# Patient Record
Sex: Female | Born: 1970 | Race: White | Hispanic: No | Marital: Married | Smoking: Never smoker
Health system: Southern US, Community
[De-identification: ages and names within clinical notes are randomized; demographics above are authoritative.]

## PROBLEM LIST (undated history)

## (undated) DIAGNOSIS — F32A Depression, unspecified: Secondary | ICD-10-CM

## (undated) DIAGNOSIS — F419 Anxiety disorder, unspecified: Secondary | ICD-10-CM

## (undated) DIAGNOSIS — Z789 Other specified health status: Secondary | ICD-10-CM

## (undated) DIAGNOSIS — E785 Hyperlipidemia, unspecified: Secondary | ICD-10-CM

## (undated) DIAGNOSIS — F329 Major depressive disorder, single episode, unspecified: Secondary | ICD-10-CM

## (undated) HISTORY — DX: Hyperlipidemia, unspecified: E78.5

## (undated) HISTORY — DX: Anxiety disorder, unspecified: F41.9

---
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
## 1994-07-17 HISTORY — PX: TONSILLECTOMY: SUR1361

## 1998-09-24 ENCOUNTER — Encounter: Payer: Self-pay | Admitting: Family Medicine

## 1998-09-24 ENCOUNTER — Ambulatory Visit (HOSPITAL_COMMUNITY): Admission: RE | Admit: 1998-09-24 | Discharge: 1998-09-24 | Payer: Self-pay | Admitting: Family Medicine

## 1998-12-08 ENCOUNTER — Ambulatory Visit (HOSPITAL_COMMUNITY): Admission: RE | Admit: 1998-12-08 | Discharge: 1998-12-08 | Payer: Self-pay | Admitting: Family Medicine

## 1998-12-08 ENCOUNTER — Encounter: Payer: Self-pay | Admitting: Family Medicine

## 1999-03-06 ENCOUNTER — Emergency Department (HOSPITAL_COMMUNITY): Admission: EM | Admit: 1999-03-06 | Discharge: 1999-03-06 | Payer: Self-pay | Admitting: Emergency Medicine

## 1999-09-05 ENCOUNTER — Encounter: Payer: Self-pay | Admitting: *Deleted

## 1999-09-05 ENCOUNTER — Ambulatory Visit (HOSPITAL_COMMUNITY): Admission: RE | Admit: 1999-09-05 | Discharge: 1999-09-05 | Payer: Self-pay | Admitting: *Deleted

## 2001-09-09 ENCOUNTER — Other Ambulatory Visit: Admission: RE | Admit: 2001-09-09 | Discharge: 2001-09-09 | Payer: Self-pay | Admitting: *Deleted

## 2002-09-23 ENCOUNTER — Other Ambulatory Visit: Admission: RE | Admit: 2002-09-23 | Discharge: 2002-09-23 | Payer: Self-pay | Admitting: *Deleted

## 2003-09-17 ENCOUNTER — Inpatient Hospital Stay (HOSPITAL_COMMUNITY): Admission: AD | Admit: 2003-09-17 | Discharge: 2003-09-18 | Payer: Self-pay | Admitting: Obstetrics and Gynecology

## 2003-10-20 ENCOUNTER — Inpatient Hospital Stay (HOSPITAL_COMMUNITY): Admission: AD | Admit: 2003-10-20 | Discharge: 2003-10-22 | Payer: Self-pay | Admitting: Obstetrics and Gynecology

## 2003-11-17 ENCOUNTER — Other Ambulatory Visit: Admission: RE | Admit: 2003-11-17 | Discharge: 2003-11-17 | Payer: Self-pay | Admitting: Obstetrics and Gynecology

## 2003-12-17 ENCOUNTER — Ambulatory Visit (HOSPITAL_COMMUNITY): Admission: RE | Admit: 2003-12-17 | Discharge: 2003-12-17 | Payer: Self-pay | Admitting: Family Medicine

## 2004-11-28 ENCOUNTER — Other Ambulatory Visit: Admission: RE | Admit: 2004-11-28 | Discharge: 2004-11-28 | Payer: Self-pay | Admitting: Obstetrics and Gynecology

## 2005-04-04 ENCOUNTER — Ambulatory Visit: Payer: Self-pay | Admitting: Family Medicine

## 2005-04-21 ENCOUNTER — Ambulatory Visit: Payer: Self-pay | Admitting: Family Medicine

## 2005-06-10 ENCOUNTER — Inpatient Hospital Stay (HOSPITAL_COMMUNITY): Admission: AD | Admit: 2005-06-10 | Discharge: 2005-06-11 | Payer: Self-pay | Admitting: Obstetrics and Gynecology

## 2005-07-06 ENCOUNTER — Encounter (INDEPENDENT_AMBULATORY_CARE_PROVIDER_SITE_OTHER): Payer: Self-pay | Admitting: Specialist

## 2005-07-06 ENCOUNTER — Inpatient Hospital Stay (HOSPITAL_COMMUNITY): Admission: AD | Admit: 2005-07-06 | Discharge: 2005-07-08 | Payer: Self-pay | Admitting: Obstetrics and Gynecology

## 2005-08-09 ENCOUNTER — Ambulatory Visit: Payer: Self-pay | Admitting: Family Medicine

## 2005-10-10 ENCOUNTER — Ambulatory Visit: Payer: Self-pay | Admitting: Family Medicine

## 2006-04-24 DIAGNOSIS — F339 Major depressive disorder, recurrent, unspecified: Secondary | ICD-10-CM | POA: Insufficient documentation

## 2006-04-24 DIAGNOSIS — E7800 Pure hypercholesterolemia, unspecified: Secondary | ICD-10-CM | POA: Insufficient documentation

## 2006-12-14 ENCOUNTER — Encounter: Admission: RE | Admit: 2006-12-14 | Discharge: 2006-12-14 | Payer: Self-pay | Admitting: Obstetrics and Gynecology

## 2007-02-01 ENCOUNTER — Encounter: Admission: RE | Admit: 2007-02-01 | Discharge: 2007-02-01 | Payer: Self-pay | Admitting: Obstetrics and Gynecology

## 2007-02-08 ENCOUNTER — Encounter: Admission: RE | Admit: 2007-02-08 | Discharge: 2007-02-08 | Payer: Self-pay | Admitting: Obstetrics and Gynecology

## 2007-02-21 ENCOUNTER — Encounter: Admission: RE | Admit: 2007-02-21 | Discharge: 2007-02-21 | Payer: Self-pay | Admitting: Obstetrics and Gynecology

## 2007-07-31 ENCOUNTER — Encounter: Admission: RE | Admit: 2007-07-31 | Discharge: 2007-07-31 | Payer: Self-pay | Admitting: Radiology

## 2007-12-13 ENCOUNTER — Ambulatory Visit: Payer: Self-pay | Admitting: Family Medicine

## 2008-04-09 ENCOUNTER — Ambulatory Visit: Payer: Self-pay | Admitting: Family Medicine

## 2008-04-09 DIAGNOSIS — J111 Influenza due to unidentified influenza virus with other respiratory manifestations: Secondary | ICD-10-CM | POA: Insufficient documentation

## 2010-08-07 ENCOUNTER — Encounter: Payer: Self-pay | Admitting: Orthopedic Surgery

## 2010-12-02 NOTE — Discharge Summary (Signed)
NAME:  Cynthia [name], Cynthia [name]                      ACCOUNT NO.:  [id number]   MEDICAL RECORD NO.:  [id number]                   PATIENT TYPE:  INP   LOCATION:  9121                                 FACILITY:  [facility]   PHYSICIAN:  Gerrit Friends. Aldona Bar, M.D.                DATE OF BIRTH:  04/16/1971   DATE OF ADMISSION:  10/20/2003  DATE OF DISCHARGE:                                 DISCHARGE SUMMARY   DISCHARGE DIAGNOSES:  1. Term pregnancy delivered, 6-pound 15-ounce female infant, Apgars 8 and 9.  2. Blood type A positive.   PROCEDURES:  1. Low forceps delivery.  2. Partial third degree tear and repair.   SUMMARY:  This 40 year old primigravida was admitted at 39 weeks in labor.  She presented to the hospital with ruptured membranes - thick meconium-  stained fluid was noted.  She progressed to full dilatation and pushed to +3  station and fetal heart was noted to be bradycardic.  A vacuum extractor was  applied but did not provide adequate descent.  In order to proceed as  quickly as possible, forceps were used and applied without complication and  the baby was delivered in one push.  The baby had Apgars of 8 and 9, a cord  pH of 7.23, and did well.  A third degree tear was repaired without  difficulty.   On the morning of October 21, 2003 the patient's hemoglobin was 8.7 with a  white count of 17,400 and a platelet count of 215,000.  She did well  postpartum and on the morning of April 7 was ambulating without difficulty,  tolerating a regular diet well, was having no difficulty with breastfeeding,  was afebrile, vital signs were stable, perineal pain was controlled with  analgesia, and she was desirous of discharge.  Accordingly, she was given  all appropriate instructions and understood all instructions well.   DISCHARGE MEDICATIONS:  1. Vitamins one a day as long as she is breastfeeding.  2. Ferous sulfate 300 mg once to twice daily.  3. Motrin 600 mg q.6h. as needed for pain.  4. Tylox  one to two q.4-6h. as needed for severe pain.   She will return to the office for follow-up in approximately 4 weeks' time  or as needed.   CONDITION ON DISCHARGE:  Improved.                                               Gerrit Friends. Aldona Bar, M.D.    RMW/MEDQ  D:  10/22/2003  T:  10/22/2003  Job:  [id number]

## 2011-01-12 ENCOUNTER — Inpatient Hospital Stay (HOSPITAL_COMMUNITY)
Admission: AD | Admit: 2011-01-12 | Discharge: 2011-01-12 | Disposition: A | Discharge status: Home / Self Care | Payer: 59 | Source: Ambulatory Visit | Attending: Obstetrics & Gynecology | Admitting: Obstetrics & Gynecology

## 2011-01-12 DIAGNOSIS — O4700 False labor before 37 completed weeks of gestation, unspecified trimester: Secondary | ICD-10-CM | POA: Insufficient documentation

## 2011-01-20 ENCOUNTER — Inpatient Hospital Stay (HOSPITAL_COMMUNITY)
Admission: AD | Admit: 2011-01-20 | Discharge: 2011-01-20 | Disposition: A | Discharge status: Home / Self Care | Payer: 59 | Source: Ambulatory Visit | Attending: Obstetrics and Gynecology | Admitting: Obstetrics and Gynecology

## 2011-01-20 DIAGNOSIS — O479 False labor, unspecified: Secondary | ICD-10-CM | POA: Insufficient documentation

## 2011-01-27 ENCOUNTER — Inpatient Hospital Stay (HOSPITAL_COMMUNITY): Admission: RE | Admit: 2011-01-27 | Payer: Self-pay | Source: Ambulatory Visit

## 2011-01-27 ENCOUNTER — Inpatient Hospital Stay (HOSPITAL_COMMUNITY): Payer: 59 | Admitting: Anesthesiology

## 2011-01-27 ENCOUNTER — Inpatient Hospital Stay (HOSPITAL_COMMUNITY)
Admission: AD | Admit: 2011-01-27 | Discharge: 2011-01-29 | DRG: 775 | Disposition: A | Discharge status: Home / Self Care | Payer: 59 | Source: Ambulatory Visit | Attending: Obstetrics and Gynecology | Admitting: Obstetrics and Gynecology

## 2011-01-27 ENCOUNTER — Encounter (HOSPITAL_COMMUNITY): Payer: Self-pay | Admitting: Anesthesiology

## 2011-01-27 ENCOUNTER — Encounter (HOSPITAL_COMMUNITY): Payer: Self-pay | Admitting: *Deleted

## 2011-01-27 HISTORY — DX: Other specified health status: Z78.9

## 2011-01-27 LAB — CBC
HCT: 35.5 % — ABNORMAL LOW (ref 36.0–46.0)
MCH: 28.8 pg (ref 26.0–34.0)
MCHC: 33 g/dL (ref 30.0–36.0)
MCV: 87.4 fL (ref 78.0–100.0)
RDW: 14.1 % (ref 11.5–15.5)

## 2011-01-27 LAB — ABO/RH: RH Type: POSITIVE

## 2011-01-27 LAB — TYPE AND SCREEN: Antibody Screen: NEGATIVE

## 2011-01-27 MED ORDER — BENZOCAINE-MENTHOL 20-0.5 % EX AERO
INHALATION_SPRAY | CUTANEOUS | Status: AC
  Administered 2011-01-28: 1 via TOPICAL
  Filled 2011-01-27: qty 56

## 2011-01-27 MED ORDER — LIDOCAINE HCL (PF) 1 % IJ SOLN
30.0000 mL | Freq: Once | INTRAMUSCULAR | Status: DC | PRN
  Filled 2011-01-27 (×2): qty 30

## 2011-01-27 MED ORDER — FENTANYL 2.5 MCG/ML BUPIVACAINE 1/10 % EPIDURAL INFUSION (WH - ANES): 2.0000 mL/h | INTRAMUSCULAR | Status: DC

## 2011-01-27 MED ORDER — SODIUM CHLORIDE 0.9 % IJ SOLN: 3.0000 mL | INTRAMUSCULAR | Status: DC | PRN

## 2011-01-27 MED ORDER — ZOLPIDEM TARTRATE 5 MG PO TABS: 5.0000 mg | ORAL_TABLET | Freq: Every evening | ORAL | Status: DC | PRN

## 2011-01-27 MED ORDER — LACTATED RINGERS IV SOLN: 500.0000 mL | Freq: Once | INTRAVENOUS | Status: DC

## 2011-01-27 MED ORDER — DIPHENHYDRAMINE HCL 25 MG PO CAPS: 25.0000 mg | ORAL_CAPSULE | Freq: Four times a day (QID) | ORAL | Status: DC | PRN

## 2011-01-27 MED ORDER — LIDOCAINE HCL 1.5 % IJ SOLN
INTRAMUSCULAR | Status: DC | PRN
  Administered 2011-01-27 (×2): 4 mL

## 2011-01-27 MED ORDER — SODIUM CHLORIDE 0.9 % IJ SOLN: 3.0000 mL | Freq: Two times a day (BID) | INTRAMUSCULAR | Status: DC

## 2011-01-27 MED ORDER — EPHEDRINE 5 MG/ML INJ
10.0000 mg | INTRAVENOUS | Status: DC | PRN
  Filled 2011-01-27: qty 4

## 2011-01-27 MED ORDER — LACTATED RINGERS IV SOLN: 500.0000 mL | INTRAVENOUS | Status: DC | PRN

## 2011-01-27 MED ORDER — PHENYLEPHRINE 40 MCG/ML (10ML) SYRINGE FOR IV PUSH (FOR BLOOD PRESSURE SUPPORT)
80.0000 ug | PREFILLED_SYRINGE | INTRAVENOUS | Status: DC | PRN
  Filled 2011-01-27: qty 5

## 2011-01-27 MED ORDER — LACTATED RINGERS IV SOLN
500.0000 mL | Freq: Once | INTRAVENOUS | Status: DC
Start: 2011-01-27 — End: 2011-01-27

## 2011-01-27 MED ORDER — PHENYLEPHRINE 40 MCG/ML (10ML) SYRINGE FOR IV PUSH (FOR BLOOD PRESSURE SUPPORT)
80.0000 ug | PREFILLED_SYRINGE | INTRAVENOUS | Status: DC | PRN
  Filled 2011-01-27 (×2): qty 5

## 2011-01-27 MED ORDER — IBUPROFEN 600 MG PO TABS: 600.0000 mg | ORAL_TABLET | Freq: Four times a day (QID) | ORAL | Status: DC | PRN

## 2011-01-27 MED ORDER — ONDANSETRON HCL 4 MG/2ML IJ SOLN: 4.0000 mg | Freq: Four times a day (QID) | INTRAMUSCULAR | Status: DC | PRN

## 2011-01-27 MED ORDER — FERROUS SULFATE 325 (65 FE) MG PO TABS
325.0000 mg | ORAL_TABLET | Freq: Two times a day (BID) | ORAL | Status: DC
  Administered 2011-01-28 – 2011-01-29 (×3): 325 mg via ORAL
  Filled 2011-01-27 (×3): qty 1

## 2011-01-27 MED ORDER — FLEET ENEMA 7-19 GM/118ML RE ENEM: 1.0000 | ENEMA | RECTAL | Status: DC | PRN

## 2011-01-27 MED ORDER — OXYTOCIN 20 UNITS IN LACTATED RINGERS INFUSION - SIMPLE
1.0000 m[IU]/min | INTRAVENOUS | Status: DC
  Administered 2011-01-27: 2 m[IU]/min via INTRAVENOUS

## 2011-01-27 MED ORDER — IBUPROFEN 800 MG PO TABS
800.0000 mg | ORAL_TABLET | Freq: Three times a day (TID) | ORAL | Status: DC
  Administered 2011-01-27 – 2011-01-29 (×5): 800 mg via ORAL
  Filled 2011-01-27 (×5): qty 1

## 2011-01-27 MED ORDER — LANOLIN HYDROUS EX OINT: TOPICAL_OINTMENT | CUTANEOUS | Status: DC | PRN

## 2011-01-27 MED ORDER — SIMETHICONE 80 MG PO CHEW: 80.0000 mg | CHEWABLE_TABLET | ORAL | Status: DC | PRN

## 2011-01-27 MED ORDER — ACETAMINOPHEN 325 MG PO TABS: 650.0000 mg | ORAL_TABLET | ORAL | Status: DC | PRN

## 2011-01-27 MED ORDER — METHYLERGONOVINE MALEATE 0.2 MG PO TABS: 0.2000 mg | ORAL_TABLET | ORAL | Status: DC | PRN

## 2011-01-27 MED ORDER — RHO D IMMUNE GLOBULIN 1500 UNIT/2ML IJ SOLN: 300.0000 ug | Freq: Once | INTRAMUSCULAR | Status: DC

## 2011-01-27 MED ORDER — EPHEDRINE 5 MG/ML INJ: 10.0000 mg | INTRAVENOUS | Status: DC | PRN

## 2011-01-27 MED ORDER — ONDANSETRON HCL 4 MG PO TABS: 4.0000 mg | ORAL_TABLET | ORAL | Status: DC | PRN

## 2011-01-27 MED ORDER — SODIUM CHLORIDE 0.9 % IV SOLN: 250.0000 mL | INTRAVENOUS | Status: DC

## 2011-01-27 MED ORDER — BENZOCAINE-MENTHOL 20-0.5 % EX AERO
1.0000 "application " | INHALATION_SPRAY | CUTANEOUS | Status: DC | PRN
  Administered 2011-01-28: 1 via TOPICAL

## 2011-01-27 MED ORDER — TERBUTALINE SULFATE 1 MG/ML IJ SOLN: 0.2500 mg | Freq: Once | INTRAMUSCULAR | Status: DC | PRN

## 2011-01-27 MED ORDER — MAGNESIUM HYDROXIDE 400 MG/5ML PO SUSP: 30.0000 mL | ORAL | Status: DC | PRN

## 2011-01-27 MED ORDER — NALBUPHINE SYRINGE 5 MG/0.5 ML
5.0000 mg | INJECTION | INTRAMUSCULAR | Status: DC | PRN
  Filled 2011-01-27: qty 0.5

## 2011-01-27 MED ORDER — OXYTOCIN 20 UNITS IN LACTATED RINGERS INFUSION - SIMPLE
125.0000 mL/h | Freq: Once | INTRAVENOUS | Status: AC
  Administered 2011-01-27: 125 mL/h via INTRAVENOUS
  Filled 2011-01-27: qty 1000

## 2011-01-27 MED ORDER — PHENYLEPHRINE 40 MCG/ML (10ML) SYRINGE FOR IV PUSH (FOR BLOOD PRESSURE SUPPORT): 80.0000 ug | PREFILLED_SYRINGE | INTRAVENOUS | Status: DC | PRN

## 2011-01-27 MED ORDER — LACTATED RINGERS IV SOLN: INTRAVENOUS | Status: DC

## 2011-01-27 MED ORDER — OXYCODONE-ACETAMINOPHEN 5-325 MG PO TABS: 2.0000 | ORAL_TABLET | ORAL | Status: DC | PRN

## 2011-01-27 MED ORDER — TETANUS-DIPHTH-ACELL PERTUSSIS 5-2.5-18.5 LF-MCG/0.5 IM SUSP
0.5000 mL | Freq: Once | INTRAMUSCULAR | Status: AC
  Administered 2011-01-28: 0.5 mL via INTRAMUSCULAR
  Filled 2011-01-27: qty 0.5

## 2011-01-27 MED ORDER — MEASLES, MUMPS & RUBELLA VAC ~~LOC~~ INJ
0.5000 mL | INJECTION | Freq: Once | SUBCUTANEOUS | Status: DC
  Filled 2011-01-27: qty 0.5

## 2011-01-27 MED ORDER — SENNOSIDES-DOCUSATE SODIUM 8.6-50 MG PO TABS
1.0000 | ORAL_TABLET | Freq: Every day | ORAL | Status: DC
  Administered 2011-01-27: 1 via ORAL
  Administered 2011-01-28: 2 via ORAL

## 2011-01-27 MED ORDER — FENTANYL 2.5 MCG/ML BUPIVACAINE 1/10 % EPIDURAL INFUSION (WH - ANES)
2.0000 mL/h | INTRAMUSCULAR | Status: DC
  Administered 2011-01-27: 14 mL/h via EPIDURAL
  Filled 2011-01-27: qty 60

## 2011-01-27 MED ORDER — EPHEDRINE 5 MG/ML INJ
10.0000 mg | INTRAVENOUS | Status: DC | PRN
  Filled 2011-01-27 (×2): qty 4

## 2011-01-27 MED ORDER — PRENATAL PLUS 27-1 MG PO TABS
1.0000 | ORAL_TABLET | Freq: Every day | ORAL | Status: DC
  Administered 2011-01-28 – 2011-01-29 (×2): 1 via ORAL
  Filled 2011-01-27 (×2): qty 1

## 2011-01-27 MED ORDER — DIPHENHYDRAMINE HCL 50 MG/ML IJ SOLN: 12.5000 mg | INTRAMUSCULAR | Status: DC | PRN

## 2011-01-27 MED ORDER — CITRIC ACID-SODIUM CITRATE 334-500 MG/5ML PO SOLN: 30.0000 mL | ORAL | Status: DC | PRN

## 2011-01-27 MED ORDER — WITCH HAZEL-GLYCERIN EX PADS: MEDICATED_PAD | CUTANEOUS | Status: DC | PRN

## 2011-01-27 NOTE — Progress Notes (Signed)
Patient was C/C/3 and pushed for 1 ctx with epidural.   NSVD  maile infant, Apgars 8,9, weight P.   The patient had 1 midline laceration repaired with 2-0vicryl r. Fundus was firm. EBL was expected. Placenta was delivered intact. Vagina was clear.  Baby was vigorous to bedside.

## 2011-01-27 NOTE — Anesthesia Procedure Notes (Addendum)
Epidural Patient location during procedure: OB Start time: 01/27/2011 11:58 AM  Staffing Anesthesiologist: Mando Blatz A. Performed by: anesthesiologist   Preanesthetic Checklist Completed: patient identified, site marked, surgical consent, pre-op evaluation, timeout performed, IV checked, risks and benefits discussed and monitors and equipment checked  Epidural Patient position: sitting Prep: site prepped and draped and DuraPrep Patient monitoring: continuous pulse ox and blood pressure Approach: midline Injection technique: LOR air  Needle:  Needle type: Tuohy  Needle gauge: 17 G Needle length: 9 cm Needle insertion depth: 5 cm Catheter type: closed end flexible Catheter size: 19 Gauge Catheter at skin depth: 10 cm Test dose: negative and 1.5% lidocaine  Assessment Events: blood not aspirated, injection not painful, no injection resistance, negative IV test and no paresthesia

## 2011-01-27 NOTE — Progress Notes (Signed)
  Patient comes in c/o for elective induction.  Otherwise has good fetal movement and no bleeding.  Past Medical History  Diagnosis Date  . No pertinent past medical history     Past Surgical History  Procedure Date  . Wisdom tooth extraction 1990  . Tonsillectomy 1996    OB History    Grav Para Term Preterm Abortions TAB SAB Ect Mult Living   4 2 2  0 1 0 1 0 0 2     # Outc Date GA Lbr Len/2nd Wgt Sex Del Anes PTL Lv   1 TRM            2 TRM            3 SAB            4 CUR               History   Social History  . Marital Status: Married    Spouse Name: N/A    Number of Children: N/A  . Years of Education: N/A   Occupational History  . Not on file.   Social History Main Topics  . Smoking status: Never Smoker   . Smokeless tobacco: Not on file  . Alcohol Use: No  . Drug Use: No  . Sexually Active: Yes   Other Topics Concern  . Not on file   Social History Narrative  . No narrative on file   Promethazine hcl   Prenatal Course:  uncomplicated  Filed Vitals:   01/27/11 0900  Temp:   Resp: 18     Lungs/Cor:  NAD Abdomen:  soft, gravid Ex:  no cords, erythema SVE:  3/60/-1 FHTs:  140s, good STV, NST R Toco:  occ    A/P   [redacted]w[redacted]d for induction.    GBS neg.  Aqsa Sensabaugh A

## 2011-01-28 LAB — CBC
Hemoglobin: 10.3 g/dL — ABNORMAL LOW (ref 12.0–15.0)
MCH: 29.2 pg (ref 26.0–34.0)
MCHC: 33.7 g/dL (ref 30.0–36.0)
Platelets: 248 10*3/uL (ref 150–400)
RDW: 14 % (ref 11.5–15.5)

## 2011-01-28 MED ORDER — OXYCODONE-ACETAMINOPHEN 5-325 MG PO TABS
1.0000 | ORAL_TABLET | ORAL | Status: DC | PRN
  Administered 2011-01-28: 1 via ORAL
  Administered 2011-01-28: 2 via ORAL
  Filled 2011-01-28: qty 1
  Filled 2011-01-28: qty 2

## 2011-01-28 NOTE — Progress Notes (Signed)
  Patient is eating, ambulating, voiding.  Pain control is good.  Filed Vitals:   01/27/11 1803 01/27/11 1900 01/27/11 2300 01/28/11 0700  BP: 110/77 110/72 97/66 105/71  Pulse: 76 66 80 70  Temp: 98.4 F (36.9 C) 98.4 F (36.9 C) 98 F (36.7 C) 97.5 F (36.4 C)  TempSrc: Oral Oral Oral Oral  Resp: 18 16 18 18   SpO2:        Fundus firm Perineum without swelling.  Lab Results  Component Value Date   WBC 12.9* 01/28/2011   HGB 10.3* 01/28/2011   HCT 30.6* 01/28/2011   MCV 86.7 01/28/2011   PLT 248 01/28/2011       A/P  Routine care.  Expect d/c per plan.  Parents desire circumcision- all risks d/w them.  Maudie Shingledecker A

## 2011-01-28 NOTE — H&P (Signed)
H&P is in progress notes. Cynthia [name]

## 2011-01-28 NOTE — Progress Notes (Signed)
BREASTFEEDING CONSULTATION SERVICES INFORMATION GIVEN TO PATIENT.  OBSERVED PROPER TECHNIQUE FOR POSITIONING AND LATCH SCORE OF 9.  BASIC TEACHING DONE.  ENCOURAGED TO CALL FOR ASSIST OR CONCERNS.

## 2011-01-29 MED ORDER — IBUPROFEN 800 MG PO TABS: 800.0000 mg | ORAL_TABLET | Freq: Three times a day (TID) | ORAL | Status: AC

## 2011-01-29 NOTE — Progress Notes (Signed)
READY FOR DISCHARGE.  PATIENT FEELING GOOD ABOUT HOW BREASTFEEDING IS GOING.  ENCOURAGED TO CALL LACTATION OFFICE FOR ANY QUESTIONS OR CONCERNS.

## 2011-01-29 NOTE — Progress Notes (Signed)
Patient is eating, ambulating, voiding.  Pain control is good.  Filed Vitals:   01/28/11 0700 01/28/11 1500 01/28/11 2200 01/29/11 0520  BP: 105/71 109/73 122/72 112/70  Pulse: 70 81 72 78  Temp: 97.5 F (36.4 C)  98.3 F (36.8 C) 98.3 F (36.8 C)  TempSrc: Oral Oral Oral Oral  Resp: 18 18 18 18   SpO2:        Fundus firm Perineum without swelling.  Lab Results  Component Value Date   WBC 12.9* 01/28/2011   HGB 10.3* 01/28/2011   HCT 30.6* 01/28/2011   MCV 86.7 01/28/2011   PLT 248 01/28/2011       A/P  Routine care.  Expect d/c per plan.    Areil Ottey A

## 2011-01-29 NOTE — Discharge Summary (Signed)
Obstetric Discharge Summary Reason for Admission: induction of labor Prenatal Procedures: none Intrapartum Procedures: spontaneous vaginal delivery Postpartum Procedures: none Complications-Operative and Postpartum: 2 degree perineal laceration  Hemoglobin  Date Value Range Status  01/28/2011 10.3* 12.0-15.0 (g/dL) Final     HCT  Date Value Range Status  01/28/2011 30.6* 36.0-46.0 (%) Final    Discharge Diagnoses: Term Pregnancy-delivered  Discharge Information: Date: 01/29/2011 Activity: pelvic rest Diet: routine Medications: None Condition: stable Instructions: refer to practice specific booklet Discharge to: home   Newborn Data: Live born  Information for the patient's newborn:  Mikayla, Chiusano [[id number]]  female ; APGAR , ; weight ;  Home with mother.  Alayjah Boehringer A 01/29/2011, 7:31 AM

## 2011-02-01 NOTE — Anesthesia Postprocedure Evaluation (Signed)
  Anesthesia Post-op Note  Patient: Cynthia [name]  Procedure(s) Performed: * Lumbar Epidural for L & D *  Patient Location: Labor and Delivery  Anesthesia Type: Epidural  Level of Consciousness: awake, alert  and oriented  Airway and Oxygen Therapy: Patient Spontanous Breathing  Post-op Pain: none  Post-op Assessment: Post-op Vital signs reviewed, Patient's Cardiovascular Status Stable, Respiratory Function Stable, No signs of Nausea or vomiting, Pain level controlled, No headache, No backache, No residual numbness and No residual motor weakness  Post-op Vital Signs: Reviewed and stable  Complications: No apparent anesthesia complications

## 2011-02-01 NOTE — Anesthesia Preprocedure Evaluation (Signed)
Anesthesia Evaluation  Name, MR# and DOB Patient awake  General Assessment Comment  Reviewed: Allergy & Precautions, H&P  and Patient's Chart, lab work & pertinent test results  Airway Mallampati: III TM Distance: >3 FB Neck ROM: full    Dental No notable dental hx    Pulmonaryneg pulmonary ROS    clear to auscultation  pulmonary exam normal   Cardiovascular    Neuro/PsychNegative Neurological ROS Negative Psych ROS  GI/Hepatic/Renal negative GI ROS, negative Liver ROS, and negative Renal ROS (+)       Endo/Other  Negative Endocrine ROS (+)   Abdominal   Musculoskeletal  Hematology negative hematology ROS (+)   Peds  Reproductive/Obstetrics (+) Pregnancy   Anesthesia Other Findings             Anesthesia Physical Anesthesia Plan  ASA: II  Anesthesia Plan: Epidural   Post-op Pain Management:    Induction:   Airway Management Planned:   Additional Equipment:   Intra-op Plan:   Post-operative Plan:   Informed Consent: I have reviewed the patients History and Physical, chart, labs and discussed the procedure including the risks, benefits and alternatives for the proposed anesthesia with the patient or authorized representative who has indicated his/her understanding and acceptance.     Plan Discussed with: Anesthesiologist  Anesthesia Plan Comments:         Anesthesia Quick Evaluation

## 2011-03-21 ENCOUNTER — Other Ambulatory Visit: Payer: Self-pay | Admitting: Obstetrics and Gynecology

## 2011-05-15 ENCOUNTER — Other Ambulatory Visit: Payer: Self-pay | Admitting: Orthopedic Surgery

## 2011-05-15 ENCOUNTER — Ambulatory Visit
Admission: RE | Admit: 2011-05-15 | Discharge: 2011-05-15 | Disposition: A | Discharge status: Home / Self Care | Payer: 59 | Source: Ambulatory Visit | Attending: Orthopedic Surgery | Admitting: Orthopedic Surgery

## 2011-05-15 DIAGNOSIS — M419 Scoliosis, unspecified: Secondary | ICD-10-CM

## 2012-01-01 ENCOUNTER — Emergency Department
Admission: EM | Admit: 2012-01-01 | Discharge: 2012-01-01 | Disposition: A | Discharge status: Home / Self Care | Payer: 59 | Source: Home / Self Care | Attending: Family Medicine | Admitting: Family Medicine

## 2012-01-01 ENCOUNTER — Encounter: Payer: Self-pay | Admitting: *Deleted

## 2012-01-01 DIAGNOSIS — J029 Acute pharyngitis, unspecified: Secondary | ICD-10-CM

## 2012-01-01 HISTORY — DX: Major depressive disorder, single episode, unspecified: F32.9

## 2012-01-01 HISTORY — DX: Depression, unspecified: F32.A

## 2012-01-01 LAB — POCT RAPID STREP A (OFFICE): Rapid Strep A Screen: NEGATIVE

## 2012-01-01 MED ORDER — AMOXICILLIN 875 MG PO TABS: 875.0000 mg | ORAL_TABLET | Freq: Two times a day (BID) | ORAL | Status: AC

## 2012-01-01 NOTE — Discharge Instructions (Signed)
If cold-like symptoms develop, begin:  Mucinex D (guaifenesin with decongestant) twice daily for congestion.  Increase fluid intake, rest. May use Afrin nasal spray (or generic oxymetazoline) twice daily for about 5 days.  Also recommend using saline nasal spray several times daily and saline nasal irrigation (AYR is a common brand) Stop all antihistamines for now, and other non-prescription cough/cold preparations. May take Ibuprofen 200mg , 4 tabs every 8 hours with food for sore throat, body aches, fever, etc. Follow-up with family doctor if not improving 7 to 10 days.

## 2012-01-01 NOTE — ED Notes (Signed)
Pt c/o sore throat, fever, and joint pain x 1 day. She also reports a possible spider bite to the back of her LT thigh x 3 days ago. She has taken tylenol and applied benadryl cream to bite.

## 2012-01-01 NOTE — ED Provider Notes (Signed)
History     CSN: [id number]  Arrival date & time 01/01/12  1437   First MD Initiated Contact with Patient 01/01/12 1451      Chief Complaint  Patient presents with  . Sore Throat  . Fever  . Joint Pain     HPI Comments: Yesterday patient developed chills, fever, myalgias, mild sore throat, headache, and soreness in anterior cervical lymph nodes.  Three days ago while outdoors she noticed some type of insect bite on her left medial thigh.  There was significant initial urticarial swelling that has receded significantly. No tick bites.  She has two children at home that have just been treated for documented strep pharyngitis.  The history is provided by the patient.    Past Medical History  Diagnosis Date  . No pertinent past medical history   . Depression     Past Surgical History  Procedure Date  . Wisdom tooth extraction 1990  . Tonsillectomy 1996    History reviewed. No pertinent family history.  History  Substance Use Topics  . Smoking status: Never Smoker   . Smokeless tobacco: Not on file  . Alcohol Use: Yes    OB History    Grav Para Term Preterm Abortions TAB SAB Ect Mult Living   4 3 3  0 1 0 1 0 0 3      Review of Systems + mild sore throat, and soreness in anterior lymph nodes No cough No pleuritic pain No wheezing No nasal congestion ? post-nasal drainage No sinus pain/pressure No itchy/red eyes ? earache No hemoptysis No SOB No fever, + chills + nausea No vomiting No abdominal pain No diarrhea No urinary symptoms + skin rash left medial thigh + fatigue + myalgias + headache Used OTC meds without relief  Allergies  Promethazine hcl  Home Medications   Current Outpatient Rx  Name Route Sig Dispense Refill  . SERTRALINE HCL 100 MG PO TABS Oral Take 50 mg by mouth daily.    . AMOXICILLIN 875 MG PO TABS Oral Take 1 tablet (875 mg total) by mouth 2 (two) times daily. 20 tablet 0    BP 106/74  Pulse 114  Temp 101 F (38.3 C)  (Oral)  Resp 18  Ht 5\' 7"  (1.702 m)  Wt 185 lb 4 oz (84.029 kg)  BMI 29.01 kg/m2  SpO2 98%  LMP 12/11/2011  Physical Exam  Skin:          On the left anterior medial thigh just above the left knee is a 4cm patch of macular erythema that appears to be resolving.     Nursing notes and Vital Signs reviewed. Appearance:  Patient appears healthy, stated age, and in no acute distress Eyes:  Pupils are equal, round, and reactive to light and accomodation.  Extraocular movement is intact.  Conjunctivae are not inflamed  Ears:  Canals normal.  Tympanic membranes normal.  Nose:  Mildly congested turbinates.  No sinus tenderness.   Pharynx:  Normal Neck:  Supple.  Tender enlarged anterior/posterior nodes are palpated bilaterally  Lungs:  Clear to auscultation.  Breath sounds are equal.  Heart:  Regular rate and rhythm without murmurs, rubs, or gallops.  Abdomen:  Nontender without masses or hepatosplenomegaly.  Bowel sounds are present.  No CVA or flank tenderness.  Extremities:  No edema.  No calf tenderness    ED Course  Procedures  none   Labs Reviewed  POCT RAPID STREP A (OFFICE) negative  STREP A DNA PROBE  pending      1. Acute pharyngitis       MDM  With a history of two children at home with strep infections, will empirically begin amoxicillin. Throat culture pending. If cold-like symptoms develop, begin:  Mucinex D (guaifenesin with decongestant) twice daily for congestion.  Increase fluid intake, rest. May use Afrin nasal spray (or generic oxymetazoline) twice daily for about 5 days.  Also recommend using saline nasal spray several times daily and saline nasal irrigation (AYR is a common brand) Stop all antihistamines for now, and other non-prescription cough/cold preparations. May take Ibuprofen 200mg , 4 tabs every 8 hours with food for sore throat, body aches, fever, etc. Follow-up with family doctor if not improving 7 to 10 days.         Lattie Haw,  MD 01/01/12 [phone]

## 2012-01-02 ENCOUNTER — Telehealth: Payer: Self-pay | Admitting: *Deleted

## 2012-01-02 NOTE — ED Notes (Signed)
Called pt given throat culture results, advised her to complete all of her Amoxicillin. Pt agrees.

## 2012-01-09 ENCOUNTER — Encounter: Payer: Self-pay | Admitting: *Deleted

## 2012-01-09 ENCOUNTER — Emergency Department
Admission: EM | Admit: 2012-01-09 | Discharge: 2012-01-09 | Disposition: A | Discharge status: Home / Self Care | Payer: BC Managed Care – PPO | Source: Home / Self Care

## 2012-01-09 DIAGNOSIS — R05 Cough: Secondary | ICD-10-CM

## 2012-01-09 DIAGNOSIS — R059 Cough, unspecified: Secondary | ICD-10-CM

## 2012-01-09 DIAGNOSIS — J069 Acute upper respiratory infection, unspecified: Secondary | ICD-10-CM

## 2012-01-09 MED ORDER — GUAIFENESIN-CODEINE 100-10 MG/5ML PO SYRP: 5.0000 mL | ORAL_SOLUTION | Freq: Four times a day (QID) | ORAL | Status: AC | PRN

## 2012-01-09 MED ORDER — AZITHROMYCIN 250 MG PO TABS: ORAL_TABLET | ORAL | Status: AC

## 2012-01-09 MED ORDER — PREDNISONE (PAK) 10 MG PO TABS: 10.0000 mg | ORAL_TABLET | Freq: Every day | ORAL | Status: AC

## 2012-01-09 NOTE — ED Notes (Signed)
Patient c/o dry cough with intermittent fever and sweats x 8 days. She has used delsym and IBF OTC

## 2012-01-09 NOTE — ED Provider Notes (Signed)
History     CSN: [id number]  Arrival date & time 01/09/12  1509   None     Chief Complaint  Patient presents with  . Cough    (Consider location/radiation/quality/duration/timing/severity/associated sxs/prior treatment) HPI Cynthia [name] is a 41 y.o. female who complains of onset of cold symptoms for 2 weeks.  The symptoms are constant and mild-moderate in severity.  She was seen here last week for he positive culture or strep pharyngitis.  She was given amoxicillin and she took all the pills as directed.  He states the sore throat symptoms went away fairly quickly.  However she has been dealing with a persistent cough and some other upper respiratory symptoms as well.  Her baby is also sick with similar symptoms.  She is taking over-the-counter cough syrup which is helping a little bit. No sore throat + cough No pleuritic pain No wheezing + nasal congestion + post-nasal drainage + sinus pain/pressure No chest congestion No itchy/red eyes No earache No hemoptysis No SOB + chills/sweats No fever No nausea No vomiting No abdominal pain No diarrhea No skin rashes No fatigue No myalgias No headache    Past Medical History  Diagnosis Date  . No pertinent past medical history   . Depression     Past Surgical History  Procedure Date  . Wisdom tooth extraction 1990  . Tonsillectomy 1996    No family history on file.  History  Substance Use Topics  . Smoking status: Never Smoker   . Smokeless tobacco: Not on file  . Alcohol Use: Yes    OB History    Grav Para Term Preterm Abortions TAB SAB Ect Mult Living   4 3 3  0 1 0 1 0 0 3      Review of Systems  All other systems reviewed and are negative.    Allergies  Promethazine hcl  Home Medications   Current Outpatient Rx  Name Route Sig Dispense Refill  . AMOXICILLIN 875 MG PO TABS Oral Take 1 tablet (875 mg total) by mouth 2 (two) times daily. 20 tablet 0  . AZITHROMYCIN 250 MG PO TABS  Use as directed 1  each 0  . GUAIFENESIN-CODEINE 100-10 MG/5ML PO SYRP Oral Take 5 mLs by mouth 4 (four) times daily as needed for cough or congestion. 120 mL 0  . PREDNISONE (PAK) 10 MG PO TABS Oral Take 1 tablet (10 mg total) by mouth daily. 6 day pack, use as directed, Disp 1 pack 21 tablet 0  . SERTRALINE HCL 100 MG PO TABS Oral Take 50 mg by mouth daily.      BP 116/80  Pulse 87  Temp 98 F (36.7 C) (Oral)  Resp 16  Ht 5\' 7"  (1.702 m)  Wt 184 lb (83.462 kg)  BMI 28.82 kg/m2  SpO2 97%  LMP 12/11/2011  Physical Exam  Nursing note and vitals reviewed. Constitutional: She is oriented to person, place, and time. She appears well-developed and well-nourished.  HENT:  Head: Normocephalic and atraumatic.  Right Ear: Tympanic membrane, external ear and ear canal normal.  Left Ear: Tympanic membrane, external ear and ear canal normal.  Nose: Mucosal edema and rhinorrhea present.  Mouth/Throat: Posterior oropharyngeal erythema present. No oropharyngeal exudate or posterior oropharyngeal edema.  Eyes: No scleral icterus.  Neck: Neck supple.  Cardiovascular: Regular rhythm and normal heart sounds.   Pulmonary/Chest: Effort normal and breath sounds normal. No respiratory distress. She has no decreased breath sounds. She has no wheezes. She has no  rhonchi.  Neurological: She is alert and oriented to person, place, and time.  Skin: Skin is warm and dry.  Psychiatric: She has a normal mood and affect. Her speech is normal.    ED Course  Procedures (including critical care time)  Labs Reviewed - No data to display No results found.   1. Cough   2. Acute upper respiratory infections of unspecified site       MDM  1)  the patient just finished her round of amoxicillin for strep pharyngitis.  I will give her prescription for prednisone and Cheratussin.  I also gave her prescription for a Z-Pak but I would like her to hold for a couple days before taking this.  It is currently likely viral versus just  an irritant pharyngitis. 2)  Use nasal saline solution (over the counter) at least 3 times a day. 3)  Use over the counter decongestants like Zyrtec-D every 12 hours as needed to help with congestion.  If you have hypertension, do not take medicines with sudafed.  4)  Can take tylenol every 6 hours or motrin every 8 hours for pain or fever. 5)  Follow up with your primary doctor if no improvement in 5-7 days, sooner if increasing pain, fever, or new symptoms.     Marlaine Hind, MD 01/09/12 [phone]

## 2013-07-14 ENCOUNTER — Other Ambulatory Visit: Payer: Self-pay | Admitting: Obstetrics and Gynecology

## 2013-07-14 DIAGNOSIS — N6310 Unspecified lump in the right breast, unspecified quadrant: Secondary | ICD-10-CM

## 2013-07-15 ENCOUNTER — Ambulatory Visit
Admission: RE | Admit: 2013-07-15 | Discharge: 2013-07-15 | Disposition: A | Discharge status: Home / Self Care | Payer: BC Managed Care – PPO | Source: Ambulatory Visit | Attending: Obstetrics and Gynecology | Admitting: Obstetrics and Gynecology

## 2013-07-15 ENCOUNTER — Other Ambulatory Visit: Payer: Self-pay | Admitting: Obstetrics and Gynecology

## 2013-07-15 DIAGNOSIS — N6310 Unspecified lump in the right breast, unspecified quadrant: Secondary | ICD-10-CM

## 2013-07-16 ENCOUNTER — Ambulatory Visit
Admission: RE | Admit: 2013-07-16 | Discharge: 2013-07-16 | Disposition: A | Discharge status: Home / Self Care | Payer: BC Managed Care – PPO | Source: Ambulatory Visit | Attending: Obstetrics and Gynecology | Admitting: Obstetrics and Gynecology

## 2013-07-16 ENCOUNTER — Other Ambulatory Visit: Payer: BC Managed Care – PPO

## 2013-07-16 ENCOUNTER — Other Ambulatory Visit: Payer: Self-pay | Admitting: Obstetrics and Gynecology

## 2013-07-16 DIAGNOSIS — N6310 Unspecified lump in the right breast, unspecified quadrant: Secondary | ICD-10-CM

## 2013-07-16 HISTORY — PX: BREAST BIOPSY: SHX20

## 2013-07-21 ENCOUNTER — Other Ambulatory Visit: Payer: BC Managed Care – PPO

## 2013-08-06 ENCOUNTER — Encounter (INDEPENDENT_AMBULATORY_CARE_PROVIDER_SITE_OTHER): Payer: Self-pay

## 2013-08-06 ENCOUNTER — Ambulatory Visit (INDEPENDENT_AMBULATORY_CARE_PROVIDER_SITE_OTHER): Payer: BC Managed Care – PPO | Admitting: Physical Therapy

## 2013-08-06 DIAGNOSIS — R269 Unspecified abnormalities of gait and mobility: Secondary | ICD-10-CM

## 2013-08-06 DIAGNOSIS — R609 Edema, unspecified: Secondary | ICD-10-CM

## 2013-08-06 DIAGNOSIS — M25669 Stiffness of unspecified knee, not elsewhere classified: Secondary | ICD-10-CM

## 2013-08-06 DIAGNOSIS — M25569 Pain in unspecified knee: Secondary | ICD-10-CM

## 2013-08-06 DIAGNOSIS — M25469 Effusion, unspecified knee: Secondary | ICD-10-CM

## 2013-08-07 ENCOUNTER — Encounter (INDEPENDENT_AMBULATORY_CARE_PROVIDER_SITE_OTHER): Payer: BC Managed Care – PPO

## 2013-08-07 DIAGNOSIS — M25469 Effusion, unspecified knee: Secondary | ICD-10-CM

## 2013-08-07 DIAGNOSIS — M25669 Stiffness of unspecified knee, not elsewhere classified: Secondary | ICD-10-CM

## 2013-08-07 DIAGNOSIS — M25569 Pain in unspecified knee: Secondary | ICD-10-CM

## 2013-08-07 DIAGNOSIS — R609 Edema, unspecified: Secondary | ICD-10-CM

## 2013-08-07 DIAGNOSIS — R269 Unspecified abnormalities of gait and mobility: Secondary | ICD-10-CM

## 2013-08-13 ENCOUNTER — Encounter (INDEPENDENT_AMBULATORY_CARE_PROVIDER_SITE_OTHER): Payer: BC Managed Care – PPO | Admitting: Physical Therapy

## 2013-08-13 DIAGNOSIS — M25469 Effusion, unspecified knee: Secondary | ICD-10-CM

## 2013-08-13 DIAGNOSIS — M25569 Pain in unspecified knee: Secondary | ICD-10-CM

## 2013-08-13 DIAGNOSIS — M25669 Stiffness of unspecified knee, not elsewhere classified: Secondary | ICD-10-CM

## 2013-08-13 DIAGNOSIS — R229 Localized swelling, mass and lump, unspecified: Secondary | ICD-10-CM

## 2013-08-13 DIAGNOSIS — R233 Spontaneous ecchymoses: Secondary | ICD-10-CM

## 2013-08-14 ENCOUNTER — Encounter (INDEPENDENT_AMBULATORY_CARE_PROVIDER_SITE_OTHER): Payer: BC Managed Care – PPO | Admitting: Physical Therapy

## 2013-08-14 DIAGNOSIS — R269 Unspecified abnormalities of gait and mobility: Secondary | ICD-10-CM

## 2013-08-14 DIAGNOSIS — R609 Edema, unspecified: Secondary | ICD-10-CM

## 2013-08-14 DIAGNOSIS — M25469 Effusion, unspecified knee: Secondary | ICD-10-CM

## 2013-08-14 DIAGNOSIS — M25569 Pain in unspecified knee: Secondary | ICD-10-CM

## 2013-08-18 ENCOUNTER — Encounter (INDEPENDENT_AMBULATORY_CARE_PROVIDER_SITE_OTHER): Payer: BC Managed Care – PPO | Admitting: Physical Therapy

## 2013-08-18 DIAGNOSIS — R269 Unspecified abnormalities of gait and mobility: Secondary | ICD-10-CM

## 2013-08-18 DIAGNOSIS — M25469 Effusion, unspecified knee: Secondary | ICD-10-CM

## 2013-08-18 DIAGNOSIS — R609 Edema, unspecified: Secondary | ICD-10-CM

## 2013-08-18 DIAGNOSIS — M25569 Pain in unspecified knee: Secondary | ICD-10-CM

## 2013-08-18 DIAGNOSIS — M25669 Stiffness of unspecified knee, not elsewhere classified: Secondary | ICD-10-CM

## 2013-08-20 ENCOUNTER — Encounter (INDEPENDENT_AMBULATORY_CARE_PROVIDER_SITE_OTHER): Payer: BC Managed Care – PPO | Admitting: Physical Therapy

## 2013-08-20 DIAGNOSIS — M25669 Stiffness of unspecified knee, not elsewhere classified: Secondary | ICD-10-CM

## 2013-08-20 DIAGNOSIS — R269 Unspecified abnormalities of gait and mobility: Secondary | ICD-10-CM

## 2013-08-20 DIAGNOSIS — M25469 Effusion, unspecified knee: Secondary | ICD-10-CM

## 2013-08-20 DIAGNOSIS — R609 Edema, unspecified: Secondary | ICD-10-CM

## 2013-08-20 DIAGNOSIS — M25569 Pain in unspecified knee: Secondary | ICD-10-CM

## 2013-08-25 ENCOUNTER — Encounter (INDEPENDENT_AMBULATORY_CARE_PROVIDER_SITE_OTHER): Payer: BC Managed Care – PPO | Admitting: Physical Therapy

## 2013-08-25 DIAGNOSIS — M25469 Effusion, unspecified knee: Secondary | ICD-10-CM

## 2013-08-25 DIAGNOSIS — R269 Unspecified abnormalities of gait and mobility: Secondary | ICD-10-CM

## 2013-08-25 DIAGNOSIS — M25669 Stiffness of unspecified knee, not elsewhere classified: Secondary | ICD-10-CM

## 2013-08-25 DIAGNOSIS — M25569 Pain in unspecified knee: Secondary | ICD-10-CM

## 2013-08-25 DIAGNOSIS — R609 Edema, unspecified: Secondary | ICD-10-CM

## 2013-08-27 ENCOUNTER — Encounter (INDEPENDENT_AMBULATORY_CARE_PROVIDER_SITE_OTHER): Payer: BC Managed Care – PPO

## 2013-08-27 DIAGNOSIS — M25469 Effusion, unspecified knee: Secondary | ICD-10-CM

## 2013-08-27 DIAGNOSIS — R609 Edema, unspecified: Secondary | ICD-10-CM

## 2013-08-27 DIAGNOSIS — M25569 Pain in unspecified knee: Secondary | ICD-10-CM

## 2013-08-27 DIAGNOSIS — R269 Unspecified abnormalities of gait and mobility: Secondary | ICD-10-CM

## 2013-09-01 ENCOUNTER — Other Ambulatory Visit: Payer: Self-pay | Admitting: Obstetrics and Gynecology

## 2013-09-01 DIAGNOSIS — Z09 Encounter for follow-up examination after completed treatment for conditions other than malignant neoplasm: Secondary | ICD-10-CM

## 2013-09-09 ENCOUNTER — Ambulatory Visit
Admission: RE | Admit: 2013-09-09 | Discharge: 2013-09-09 | Disposition: A | Discharge status: Home / Self Care | Payer: BC Managed Care – PPO | Source: Ambulatory Visit | Attending: Obstetrics and Gynecology | Admitting: Obstetrics and Gynecology

## 2013-09-09 DIAGNOSIS — Z09 Encounter for follow-up examination after completed treatment for conditions other than malignant neoplasm: Secondary | ICD-10-CM

## 2013-10-20 ENCOUNTER — Encounter: Payer: Self-pay | Admitting: Physician Assistant

## 2013-10-20 ENCOUNTER — Ambulatory Visit (INDEPENDENT_AMBULATORY_CARE_PROVIDER_SITE_OTHER): Payer: BC Managed Care – PPO | Admitting: Physician Assistant

## 2013-10-20 VITALS — BP 119/68 | HR 96 | Ht 67.0 in | Wt 192.0 lb

## 2013-10-20 DIAGNOSIS — R05 Cough: Secondary | ICD-10-CM

## 2013-10-20 DIAGNOSIS — B9689 Other specified bacterial agents as the cause of diseases classified elsewhere: Secondary | ICD-10-CM

## 2013-10-20 DIAGNOSIS — J329 Chronic sinusitis, unspecified: Secondary | ICD-10-CM

## 2013-10-20 DIAGNOSIS — A499 Bacterial infection, unspecified: Secondary | ICD-10-CM

## 2013-10-20 DIAGNOSIS — R059 Cough, unspecified: Secondary | ICD-10-CM

## 2013-10-20 MED ORDER — HYDROCODONE-HOMATROPINE 5-1.5 MG/5ML PO SYRP: 5.0000 mL | ORAL_SOLUTION | Freq: Every evening | ORAL | Status: DC | PRN

## 2013-10-20 MED ORDER — AMOXICILLIN 500 MG PO CAPS: 500.0000 mg | ORAL_CAPSULE | Freq: Two times a day (BID) | ORAL | Status: DC

## 2013-10-20 MED ORDER — METHYLPREDNISOLONE SODIUM SUCC 125 MG IJ SOLR
125.0000 mg | Freq: Once | INTRAMUSCULAR | Status: AC
  Administered 2013-10-20: 125 mg via INTRAMUSCULAR

## 2013-10-20 NOTE — Patient Instructions (Signed)

## 2013-10-20 NOTE — Progress Notes (Addendum)
Subjective:    Patient ID: Cynthia [name], female    DOB: [birth year], 43 y.o.   MRN: [id number]  HPI Patient is a 43 year old female who presents to the clinic to establish care. Patient does not take any ongoing medications. At one point she was diagnosed with hypercholesterolemia. She is not on any medication for that. She has had depression in the past. She denies any feelings of helplessness or hopelessness today.  She she does have some sinus pressure for the last 10 days. Her teeth ache.  She has recently felt some chest tightness. She denies any wheezing or shortness of breath. Her cough is dry. Her cough is worsened at night and keeps her up. She thought it could be allergies but has continued to worsen. She denies any fever, chills, nausea or vomiting. She's been taking over-the-counter Sudafed and Mucinex. She is flying on Thursday to leave town and would like to be better before she leaves. She does not have any past medical history of lung diseases/asthma.  . Active Ambulatory Problems    Diagnosis Date Noted  . HYPERCHOLESTEROLEMIA 04/24/2006  . DEPRESSION, MAJOR, RECURRENT 04/24/2006  . INFLUENZA, WITH RESPIRATORY SYMPTOMS 04/09/2008   Resolved Ambulatory Problems    Diagnosis Date Noted  . No Resolved Ambulatory Problems   Past Medical History  Diagnosis Date  . No pertinent past medical history   . Depression    . Family History  Problem Relation Age of Onset  . Diabetes Father   . Cancer Maternal Grandmother   . Alcohol abuse Paternal Grandfather    . History   Social History  . Marital Status: Married    Spouse Name: N/A    Number of Children: N/A  . Years of Education: N/A   Occupational History  . Not on file.   Social History Main Topics  . Smoking status: Never Smoker   . Smokeless tobacco: Not on file  . Alcohol Use: Yes  . Drug Use: No  . Sexual Activity: Yes   Other Topics Concern  . Not on file   Social History Narrative  . No  narrative on file      Review of Systems  All other systems reviewed and are negative.       Objective:   Physical Exam  Constitutional: She is oriented to person, place, and time. She appears well-developed and well-nourished.  HENT:  Head: Normocephalic and atraumatic.  TM's clear bilaterally.  Maxillary tenderness to palpation bilaterally.  Oropharynx erythematous with no exudate or swollen tonsils.  Bilateral nasal turbinates red and swollen.   Eyes: Conjunctivae are normal. Right eye exhibits no discharge. Left eye exhibits no discharge.  Neck: Normal range of motion. Neck supple.  Cardiovascular: Normal rate, regular rhythm and normal heart sounds.   Pulmonary/Chest: Effort normal and breath sounds normal. She has no wheezes.  Lymphadenopathy:    She has no cervical adenopathy.  Neurological: She is alert and oriented to person, place, and time.  Skin: Skin is warm and dry.  Psychiatric: She has a normal mood and affect. Her behavior is normal.          Assessment & Plan:  Depression screen 0/2.  Sinusitis/cough-Solu-Medrol 125 mg was given IM today. Amoxil was given for 10 days. Hycodan for cough at bedtime. Warned of sedation with cough syrup. Call if not improving in the next 24-48 hours since will be flying on Thursday. Recommended nasal spray over-the-counter Flonase 2 sprays each nostril daily.  Discuss  with patient making a visit for a complete physical with fasting labs. She does have an OB that does labs regularly. I suggested that she see if they can fax her most recent labs to make sure everything is up-to-date. Vaccines he seemed up-to-date.

## 2014-05-18 ENCOUNTER — Encounter: Payer: Self-pay | Admitting: Physician Assistant

## 2014-08-20 ENCOUNTER — Other Ambulatory Visit: Payer: Self-pay | Admitting: Obstetrics and Gynecology

## 2014-08-20 DIAGNOSIS — R52 Pain, unspecified: Secondary | ICD-10-CM

## 2014-08-20 DIAGNOSIS — Z803 Family history of malignant neoplasm of breast: Secondary | ICD-10-CM

## 2014-08-20 DIAGNOSIS — N63 Unspecified lump in unspecified breast: Secondary | ICD-10-CM

## 2014-08-21 ENCOUNTER — Other Ambulatory Visit: Payer: Self-pay | Admitting: Obstetrics and Gynecology

## 2014-08-21 DIAGNOSIS — N63 Unspecified lump in unspecified breast: Secondary | ICD-10-CM

## 2014-08-21 DIAGNOSIS — Z803 Family history of malignant neoplasm of breast: Secondary | ICD-10-CM

## 2014-08-21 DIAGNOSIS — R52 Pain, unspecified: Secondary | ICD-10-CM

## 2014-08-28 ENCOUNTER — Encounter (INDEPENDENT_AMBULATORY_CARE_PROVIDER_SITE_OTHER): Payer: Self-pay

## 2014-08-28 ENCOUNTER — Ambulatory Visit
Admission: RE | Admit: 2014-08-28 | Discharge: 2014-08-28 | Disposition: A | Discharge status: Home / Self Care | Payer: BLUE CROSS/BLUE SHIELD | Source: Ambulatory Visit | Attending: Obstetrics and Gynecology | Admitting: Obstetrics and Gynecology

## 2014-08-28 DIAGNOSIS — R52 Pain, unspecified: Secondary | ICD-10-CM

## 2014-08-28 DIAGNOSIS — Z803 Family history of malignant neoplasm of breast: Secondary | ICD-10-CM

## 2014-08-28 DIAGNOSIS — N63 Unspecified lump in unspecified breast: Secondary | ICD-10-CM

## 2015-02-23 ENCOUNTER — Encounter: Payer: Self-pay | Admitting: Family Medicine

## 2015-02-23 ENCOUNTER — Ambulatory Visit (INDEPENDENT_AMBULATORY_CARE_PROVIDER_SITE_OTHER): Payer: BLUE CROSS/BLUE SHIELD | Admitting: Family Medicine

## 2015-02-23 VITALS — BP 98/61 | HR 57 | Wt 172.0 lb

## 2015-02-23 DIAGNOSIS — G43709 Chronic migraine without aura, not intractable, without status migrainosus: Secondary | ICD-10-CM

## 2015-02-23 DIAGNOSIS — F515 Nightmare disorder: Secondary | ICD-10-CM | POA: Diagnosis not present

## 2015-02-23 DIAGNOSIS — IMO0002 Reserved for concepts with insufficient information to code with codable children: Secondary | ICD-10-CM

## 2015-02-23 MED ORDER — TOPIRAMATE 25 MG PO TABS: ORAL_TABLET | ORAL | Status: DC

## 2015-02-23 NOTE — Assessment & Plan Note (Addendum)
start Topamax. Recheck in one month. If not better would consider CT scan of the head

## 2015-02-23 NOTE — Patient Instructions (Signed)
Thank you for coming in today. Consider counseling[facility] [place]  I will be happy to write a referral if needed.  return in 3-4 weeks

## 2015-02-23 NOTE — Progress Notes (Signed)
Cynthia [name] is a 44 y.o. female who presents to [facility]  today for headaches and bad dreams.  1) patient has 3 months of persistent severe headache. She has headaches 2 or 3 times a week and the headaches to the last all day. She denies any weakness or numbness or loss of function. The headaches are bilateral pounding. She's tried aspirin and Tylenol which helps some. No fevers chills nausea vomiting or diarrhea.  2) Bad dreams. Patient is a 7 with history of newly every night skin area disturbing dreams. She dreams that her children have been killed. Sometimes when she is awake she'll have intrusive bothersome thoughts. She feels no compulsion to act on these thoughts. This is obviously very distressing to the patient.   Past Medical History  Diagnosis Date  . No pertinent past medical history   . Depression    Past Surgical History  Procedure Laterality Date  . Wisdom tooth extraction  1990  . Tonsillectomy  1996   History  Substance Use Topics  . Smoking status: Never Smoker   . Smokeless tobacco: Not on file  . Alcohol Use: Yes   ROS as above Medications: Current Outpatient Prescriptions  Medication Sig Dispense Refill  . topiramate (TOPAMAX) 25 MG tablet Start 1 tablet daily at bedtime, increase to 1 tablet twice daily after 5days and increase to 2 tablets at night and 1 tablet in the morning after 5 days. 90 tablet 0   No current facility-administered medications for this visit.   Allergies  Allergen Reactions  . Promethazine Hcl     REACTION: convulsion     Exam:  BP 98/61 mmHg  Pulse 57  Wt 172 lb (78.019 kg) Gen: Well NAD HEENT: EOMI,  MMM PERRLA Lungs: Normal work of breathing. CTABL Heart: RRR no MRG Abd: NABS, Soft. Nondistended, Nontender Exts: Brisk capillary refill, warm and well perfused.  Neuro: Alert and oriented normal coordination balance gait strength sensation reflexes. Psych alert and oriented  tearful affect. No SI or HI. PHQ9 10, GAD 7 14  No results found for this or any previous visit (from the past 24 hour(s)). No results found.   Please see individual assessment and plan sections.

## 2015-02-23 NOTE — Assessment & Plan Note (Signed)
Unclear etiology. Patient has some symptoms suggestive of obsessive thoughts. Recommend counseling. Start Topamax for headache. Recheck in one month. If not better we consider SSRI.

## 2015-03-23 ENCOUNTER — Ambulatory Visit (INDEPENDENT_AMBULATORY_CARE_PROVIDER_SITE_OTHER): Payer: BLUE CROSS/BLUE SHIELD | Admitting: Family Medicine

## 2015-03-23 DIAGNOSIS — G43709 Chronic migraine without aura, not intractable, without status migrainosus: Secondary | ICD-10-CM

## 2015-03-23 NOTE — Progress Notes (Signed)
Gust Brooms "no showed" today's visit.

## 2015-07-21 ENCOUNTER — Encounter: Payer: Self-pay | Admitting: Physician Assistant

## 2015-07-21 ENCOUNTER — Ambulatory Visit (INDEPENDENT_AMBULATORY_CARE_PROVIDER_SITE_OTHER): Payer: BLUE CROSS/BLUE SHIELD | Admitting: Physician Assistant

## 2015-07-21 VITALS — BP 133/87 | HR 82 | Temp 98.7°F | Wt 180.0 lb

## 2015-07-21 DIAGNOSIS — J029 Acute pharyngitis, unspecified: Secondary | ICD-10-CM | POA: Diagnosis not present

## 2015-07-21 DIAGNOSIS — M436 Torticollis: Secondary | ICD-10-CM | POA: Diagnosis not present

## 2015-07-21 LAB — CBC WITH DIFFERENTIAL/PLATELET
BASOS ABS: 0 10*3/uL (ref 0.0–0.1)
Basophils Relative: 0 % (ref 0–1)
EOS PCT: 2 % (ref 0–5)
Eosinophils Absolute: 0.2 10*3/uL (ref 0.0–0.7)
HEMATOCRIT: 41.6 % (ref 36.0–46.0)
HEMOGLOBIN: 14.3 g/dL (ref 12.0–15.0)
LYMPHS ABS: 1.8 10*3/uL (ref 0.7–4.0)
LYMPHS PCT: 16 % (ref 12–46)
MCH: 29 pg (ref 26.0–34.0)
MCHC: 34.4 g/dL (ref 30.0–36.0)
MCV: 84.4 fL (ref 78.0–100.0)
MPV: 9.5 fL (ref 8.6–12.4)
Monocytes Absolute: 0.8 10*3/uL (ref 0.1–1.0)
Monocytes Relative: 7 % (ref 3–12)
NEUTROS ABS: 8.5 10*3/uL — AB (ref 1.7–7.7)
Neutrophils Relative %: 75 % (ref 43–77)
Platelets: 295 10*3/uL (ref 150–400)
RBC: 4.93 MIL/uL (ref 3.87–5.11)
RDW: 13.5 % (ref 11.5–15.5)
WBC: 11.3 10*3/uL — ABNORMAL HIGH (ref 4.0–10.5)

## 2015-07-21 LAB — POCT RAPID STREP A (OFFICE): Rapid Strep A Screen: NEGATIVE

## 2015-07-21 MED ORDER — AMOXICILLIN-POT CLAVULANATE 875-125 MG PO TABS: 1.0000 | ORAL_TABLET | Freq: Two times a day (BID) | ORAL | Status: DC

## 2015-07-21 NOTE — Progress Notes (Signed)
   Subjective:    Patient ID: Cynthia [name], female    DOB: 12/22/1970, 45 y.o.   MRN: [id number]  HPI Pt presents to the clinic for sore throat and neck stiffness for 3 days. She reports fever of 101 last night. She does not have tonsils but has had strep throat once without tonsils and she feels the same. She has no other body aches or chills. Her ears feel "poppy". She cannot move her neck without a lot of pain. She is taking tylenol and ibuprofen around the clock. She has no cough, SOB, runny nose, congestion.    Review of Systems See HPI.     Objective:   Physical Exam  Constitutional: She is oriented to person, place, and time. She appears well-developed and well-nourished.  HENT:  Head: Normocephalic and atraumatic.  Right Ear: External ear normal.  Left Ear: External ear normal.  TM's clear. Good light reflex.  Oropharynx erythematous, tonsils absent, uvula midline.  Bilateral nasal turbinates red and swollen. Negative for any sinus tenderness to palpation.    Eyes: Conjunctivae are normal. Right eye exhibits no discharge. Left eye exhibits no discharge.  Neck:  ROM decreased due to pain.  Bilateral anterior cervical tender and enlarged lymph nodes.  Cardiovascular: Normal rate, regular rhythm and normal heart sounds.   Pulmonary/Chest: Effort normal and breath sounds normal. She has no wheezes.  Lymphadenopathy:    She has cervical adenopathy.  Neurological: She is alert and oriented to person, place, and time.  Psychiatric: She has a normal mood and affect. Her behavior is normal.          Assessment & Plan:  Sore throat/stiff neck- rapid strep was negative. Will send for culture. centor criteria 3 points and 35 percent chance of strep. Pt reports similar symptoms historically. Sent augmentin for 10 days.  Will stop abx if strep culture comes back negative or patient not improving. Cbc ordered. Discussed concern for meningitis. If symptoms worsening or not  improving need to consider lumbar puncture.

## 2015-07-22 LAB — CULTURE, GROUP A STREP: Organism ID, Bacteria: NORMAL

## 2015-10-21 ENCOUNTER — Other Ambulatory Visit: Payer: Self-pay | Admitting: Obstetrics and Gynecology

## 2015-10-22 ENCOUNTER — Other Ambulatory Visit: Payer: Self-pay | Admitting: Obstetrics and Gynecology

## 2015-10-22 DIAGNOSIS — N63 Unspecified lump in unspecified breast: Secondary | ICD-10-CM

## 2015-10-22 LAB — CYTOLOGY - PAP

## 2015-11-01 ENCOUNTER — Ambulatory Visit
Admission: RE | Admit: 2015-11-01 | Discharge: 2015-11-01 | Disposition: A | Discharge status: Home / Self Care | Payer: BLUE CROSS/BLUE SHIELD | Source: Ambulatory Visit | Attending: Obstetrics and Gynecology | Admitting: Obstetrics and Gynecology

## 2015-11-01 DIAGNOSIS — N63 Unspecified lump in unspecified breast: Secondary | ICD-10-CM

## 2015-11-27 ENCOUNTER — Emergency Department
Admission: EM | Admit: 2015-11-27 | Discharge: 2015-11-27 | Disposition: A | Discharge status: Home / Self Care | Payer: BLUE CROSS/BLUE SHIELD | Source: Home / Self Care | Attending: Family Medicine | Admitting: Family Medicine

## 2015-11-27 ENCOUNTER — Encounter: Payer: Self-pay | Admitting: *Deleted

## 2015-11-27 DIAGNOSIS — H65191 Other acute nonsuppurative otitis media, right ear: Secondary | ICD-10-CM | POA: Diagnosis not present

## 2015-11-27 DIAGNOSIS — J069 Acute upper respiratory infection, unspecified: Secondary | ICD-10-CM

## 2015-11-27 MED ORDER — AMOXICILLIN-POT CLAVULANATE 875-125 MG PO TABS: 1.0000 | ORAL_TABLET | Freq: Two times a day (BID) | ORAL | Status: DC

## 2015-11-27 NOTE — Discharge Instructions (Signed)
You may take 400-675m Ibuprofen (Motrin) every 6-8 hours for fever and pain  Alternate with Tylenol  You may take 5062mTylenol every 4-6 hours as needed for fever and pain  Follow-up with your primary care provider next week for recheck of symptoms if not improving.  Be sure to drink plenty of fluids and rest, at least 8hrs of sleep a night, preferably more while you are sick. Return urgent care or go to closest ER if you cannot keep down fluids/signs of dehydration, fever not reducing with Tylenol, difficulty breathing/wheezing, stiff neck, worsening condition, or other concerns (see below)  Please take antibiotics as prescribed and be sure to complete entire course even if you start to feel better to ensure infection does not come back.   Cool Mist Vaporizers Vaporizers may help relieve the symptoms of a cough and cold. They add moisture to the air, which helps mucus to become thinner and less sticky. This makes it easier to breathe and cough up secretions. Cool mist vaporizers do not cause serious burns like hot mist vaporizers, which may also be called steamers or humidifiers. Vaporizers have not been proven to help with colds. You should not use a vaporizer if you are allergic to mold. HOME CARE INSTRUCTIONS  Follow the package instructions for the vaporizer.  Do not use anything other than distilled water in the vaporizer.  Do not run the vaporizer all of the time. This can cause mold or bacteria to grow in the vaporizer.  Clean the vaporizer after each time it is used.  Clean and dry the vaporizer well before storing it.  Stop using the vaporizer if worsening respiratory symptoms develop.   This information is not intended to replace advice given to you by your health care provider. Make sure you discuss any questions you have with your health care provider.   Document Released: 03/30/2004 Document Revised: 07/08/2013 Document Reviewed: 11/20/2012 Elsevier Interactive Patient  Education 20[employer]

## 2015-11-27 NOTE — ED Provider Notes (Signed)
CSN: [id number]     Arrival date & time 11/27/15  1241 History   First MD Initiated Contact with Patient 11/27/15 1309     Chief Complaint  Patient presents with  . Sore Throat  . Otalgia   (Consider location/radiation/quality/duration/timing/severity/associated sxs/prior Treatment) HPI  The pt is a 45yo female presenting to [facility] with c/o 2 days of sore throat, bilateral ear pain, congestion, and mild intermittent cough. Right is is more aching and sore than the left. Pain is mild to moderate in severity. She has tried tylenol with no relief.  She is here with her husband who has had URI symptoms for 1 week and daughter with an intermittent cough for 2 weeks. Pt reports subjective fever. Denies n/v/d.   Past Medical History  Diagnosis Date  . No pertinent past medical history   . Depression    Past Surgical History  Procedure Laterality Date  . Wisdom tooth extraction  1990  . Tonsillectomy  1996   Family History  Problem Relation Age of Onset  . Diabetes Father   . Skin cancer Father   . Cancer Maternal Grandmother   . Alcohol abuse Paternal Grandfather   . Parkinson's disease Mother   . Lupus Sister    Social History  Substance Use Topics  . Smoking status: Never Smoker   . Smokeless tobacco: None  . Alcohol Use: Yes   OB History    Gravida Para Term Preterm AB TAB SAB Ectopic Multiple Living   4 3 3  0 1 0 1 0 0 3     Review of Systems  Constitutional: Negative for fever and chills.  HENT: Positive for congestion, ear pain ( bilateral) and sore throat. Negative for trouble swallowing and voice change.   Respiratory: Positive for cough. Negative for shortness of breath.   Cardiovascular: Negative for chest pain and palpitations.  Gastrointestinal: Negative for nausea, vomiting, abdominal pain and diarrhea.  Musculoskeletal: Negative for myalgias, back pain and arthralgias.  Skin: Negative for rash.    Allergies  Promethazine hcl  Home Medications   Prior to  Admission medications   Medication Sig Start Date End Date Taking? Authorizing Provider  amoxicillin-clavulanate (AUGMENTIN) 875-125 MG tablet Take 1 tablet by mouth 2 (two) times daily. One po bid x 7 days 11/27/15   Noland Fordyce, PA-C   Meds Ordered and Administered this Visit  Medications - No data to display  BP 109/76 mmHg  Pulse 88  Temp(Src) 98.3 F (36.8 C) (Oral)  Resp 16  Ht 5' 7"  (1.702 m)  Wt 192 lb (87.091 kg)  BMI 30.06 kg/m2  SpO2 98%  LMP 11/15/2015 No data found.   Physical Exam  Constitutional: She appears well-developed and well-nourished. No distress.  HENT:  Head: Normocephalic and atraumatic.  Right Ear: No mastoid tenderness. Tympanic membrane is erythematous. Tympanic membrane is not bulging. A middle ear effusion is present.  Left Ear: Tympanic membrane normal.  Nose: Mucosal edema present.  Mouth/Throat: Uvula is midline, oropharynx is clear and moist and mucous membranes are normal.  Eyes: Conjunctivae are normal. No scleral icterus.  Neck: Normal range of motion. Neck supple.  Cardiovascular: Normal rate, regular rhythm and normal heart sounds.   Pulmonary/Chest: Effort normal and breath sounds normal. No respiratory distress. She has no wheezes. She has no rales.  Abdominal: Soft. She exhibits no distension. There is no tenderness.  Musculoskeletal: Normal range of motion.  Neurological: She is alert.  Skin: Skin is warm and dry. She is  not diaphoretic.  Nursing note and vitals reviewed.   ED Course  Procedures (including critical care time)  Labs Review Labs Reviewed - No data to display  Imaging Review No results found.    MDM   1. Acute upper respiratory infection   2. Acute nonsuppurative otitis media of right ear    Pt c/o ear pain and sore throat with nasal congestion. Husband and daughter also at [facility] being seen for cough and URI symptoms.    Right TM c/w AOM  Rx: Augmentin  Advised pt to use acetaminophen and ibuprofen  as needed for fever and pain. Encouraged rest and fluids. F/u with PCP in 7-10 days if not improving, sooner if worsening. Pt verbalized understanding and agreement with tx plan.    Noland Fordyce, PA-C 11/27/15 1528

## 2015-11-27 NOTE — ED Notes (Signed)
Pt c/o sore throat and bilateral ear pain x 2 days. Denies fever.

## 2016-04-14 ENCOUNTER — Ambulatory Visit (INDEPENDENT_AMBULATORY_CARE_PROVIDER_SITE_OTHER): Payer: BLUE CROSS/BLUE SHIELD | Admitting: Osteopathic Medicine

## 2016-04-14 ENCOUNTER — Encounter: Payer: Self-pay | Admitting: Osteopathic Medicine

## 2016-04-14 VITALS — BP 114/69 | HR 70 | Ht 67.0 in | Wt 190.0 lb

## 2016-04-14 DIAGNOSIS — F332 Major depressive disorder, recurrent severe without psychotic features: Secondary | ICD-10-CM

## 2016-04-14 DIAGNOSIS — J069 Acute upper respiratory infection, unspecified: Secondary | ICD-10-CM | POA: Diagnosis not present

## 2016-04-14 MED ORDER — SERTRALINE HCL 50 MG PO TABS: 50.0000 mg | ORAL_TABLET | Freq: Every day | ORAL | 1 refills | Status: DC

## 2016-04-14 MED ORDER — VILAZODONE HCL 10 & 20 MG PO KIT: 1.0000 | PACK | Freq: Every day | ORAL | 0 refills | Status: DC

## 2016-04-14 NOTE — Patient Instructions (Addendum)
Viibryd is my first choice for you, but if this is not covered, fill the prescription for Zoloft.   Any thoughts of hurting yourself, please call 911 or seek help at the closest ER.   I've placed referral for counseling as well. Please let us know if you don't hear back about an appointment.

## 2016-04-14 NOTE — Progress Notes (Signed)
HPI: Cynthia [name] is a 45 y.o. female  who presents to [place] today, 04/14/16,  for chief complaint of:  Chief Complaint  Patient presents with  . Ear Pain    BILATERAL     Ear pain: Will be flying on plane in few days, ear pain and sinus congestion has been a problem for about a week, she would like this looked at.   Depression: Patient states that she told the scheduler she wanted an appointment to discuss depression, patient has had depression in the past and seemed to be going through recurrence. Significant weight gain over the past 2 months, frequent crying. Has previously been on Wellbutrin, Zoloft, Paxil, see below. No thoughts of hurting herself, but she feels very down. Limited family or social support other than church. Has never been to counseling in the past.  Previous medications reviewed: Paxil, Wellbutrin, Zoloft  Paxil didn't help and made her numb and caused weight gain Wellbutrin helped a bit Zoloft separately for about 2 years     Past medical, surgical, social and family history reviewed: Past Medical History:  Diagnosis Date  . Depression   . No pertinent past medical history    Past Surgical History:  Procedure Laterality Date  . TONSILLECTOMY  1996  . [place] EXTRACTION  1990   Social History  Substance Use Topics  . Smoking status: Never Smoker  . Smokeless tobacco: Not on file  . Alcohol use Yes   Family History  Problem Relation Age of Onset  . Diabetes Father   . Skin cancer Father   . Cancer Maternal Grandmother   . Alcohol abuse Paternal Grandfather   . Parkinson's disease Mother   . Lupus Sister      Current medication list and allergy/intolerance information reviewed:   No current outpatient prescriptions on file.   No current facility-administered medications for this visit.    Allergies  Allergen Reactions  . Promethazine Hcl     REACTION: convulsion      Review of  Systems:  Constitutional:  No  fever, no chills, +recent illness, +unintentional weight changes. +significant fatigue.   HEENT: +headache, no vision change, no hearing change, No sore throat, +sinus pressure  Cardiac: No  chest pain, No  pressure,   Respiratory:  No  shortness of breath.   Neurologic: No  weakness, No  dizziness,   Psychiatric: +concerns with depression, No  concerns with anxiety, +sleep problems, No mood problems  Exam:  BP 114/69   Pulse 70   Ht 5' 7"  (1.702 m)   Wt 190 lb (86.2 kg)   BMI 29.76 kg/m   Constitutional: VS see above. General Appearance: alert, well-developed, well-nourished, NAD  Eyes: Normal lids and conjunctive, non-icteric sclera  Ears, Nose, Mouth, Throat: MMM, Normal external inspection ears/nares/mouth/lips/gums. TM normal bilaterally. Pharynx/tonsils no erythema, no exudate. Nasal mucosa normal.   Neck: No masses, trachea midline. No thyroid enlargement. No tenderness/mass appreciated. No lymphadenopathy  Respiratory: Normal respiratory effort. no wheeze, no rhonchi, no rales  Cardiovascular: S1/S2 normal, no murmur, no rub/gallop auscultated. RRR.   Neurological: Normal balance/coordination. No tremor.   Skin: warm, dry, intact. No rash/ulcer.   Psychiatric: Normal judgment/insight. Depressed tearful mood and affect. Oriented x3. No suicidal ideation, no passive death wish.     ASSESSMENT/PLAN:   Severe episode of recurrent major depressive disorder, without psychotic features ([place]) - Did well on Zoloft in the past, we'll try Viibryd at this  time but if too expensive go ahead and fill Zoloft. Patient advised needs to follow-up with me or PCP - Plan: sertraline (ZOLOFT) 50 MG tablet, Ambulatory referral to Psychology  Viral URI - Most likely cause of her congestion, physical exam was normal, RTC if no improvement/worse   Patient Instructions  Ileene Patrick is my first choice for you, but if this is not covered, fill the prescription  for Zoloft.   Any thoughts of hurting yourself, please call 911 or seek help at the closest ER.   I've placed referral for counseling as well. Please let us know if you don't hear back about an appointment.     Visit summary with medication list and pertinent instructions was printed for patient to review. All questions at time of visit were answered - patient instructed to contact office with any additional concerns. ER/RTC precautions were reviewed with the patient. Follow-up plan: Return in about 4 weeks (around 05/12/2016) for depression follow-up with PCP.

## 2016-06-23 ENCOUNTER — Encounter: Payer: Self-pay | Admitting: Physician Assistant

## 2016-06-23 ENCOUNTER — Ambulatory Visit (INDEPENDENT_AMBULATORY_CARE_PROVIDER_SITE_OTHER): Payer: BLUE CROSS/BLUE SHIELD | Admitting: Physician Assistant

## 2016-06-23 VITALS — BP 119/81 | HR 105 | Ht 67.0 in | Wt 193.0 lb

## 2016-06-23 DIAGNOSIS — M25511 Pain in right shoulder: Secondary | ICD-10-CM

## 2016-06-23 DIAGNOSIS — F332 Major depressive disorder, recurrent severe without psychotic features: Secondary | ICD-10-CM

## 2016-06-23 DIAGNOSIS — R42 Dizziness and giddiness: Secondary | ICD-10-CM

## 2016-06-23 MED ORDER — TRAZODONE HCL 50 MG PO TABS: 25.0000 mg | ORAL_TABLET | Freq: Every evening | ORAL | 1 refills | Status: DC | PRN

## 2016-06-23 MED ORDER — SERTRALINE HCL 50 MG PO TABS: 50.0000 mg | ORAL_TABLET | Freq: Every day | ORAL | 1 refills | Status: DC

## 2016-06-23 MED ORDER — DICLOFENAC SODIUM 75 MG PO TBEC: 75.0000 mg | DELAYED_RELEASE_TABLET | Freq: Two times a day (BID) | ORAL | 1 refills | Status: DC

## 2016-06-23 NOTE — Patient Instructions (Signed)
Bursitis Introduction Bursitis is when the fluid-filled sac (bursa) that covers and protects a joint is swollen (inflamed). Bursitis is most common near joints, especially the knees, elbows, hips, and shoulders. Follow these instructions at home:  Take medicines only as told by your doctor.  If you were prescribed an antibiotic medicine, finish it all even if you start to feel better.  Rest the affected area as told by your doctor.  Keep the area raised up.  Avoid doing things that make the pain worse.  Apply ice to the injured area:  Place ice in a plastic bag.  Place a towel between your skin and the bag.  Leave the ice on for 20 minutes, 2-3 times a day.  Use splints, braces, pads, or walking aids as told by your doctor.  Keep all follow-up visits as told by your doctor. This is important. Contact a doctor if:  You have more pain with home care.  You have a fever.  You have chills. This information is not intended to replace advice given to you by your health care provider. Make sure you discuss any questions you have with your health care provider. Document Released: 12/21/2009 Document Revised: 12/09/2015 Document Reviewed: 09/22/2013  2017 Elsevier

## 2016-06-23 NOTE — Progress Notes (Addendum)
Subjective:    Patient ID: Cynthia [name], female    DOB: 1970-08-12, 45 y.o.   MRN: [id number]  HPI  Patient is a 45 yo following up for depression. Patient was started on Zoloft in September of this year. She started with 1/2 a tablet for three weeks and whole tablet since October 1st. Patient reports that her mood is better; however, she has other concerns. Patient reports she is experiencing dry eyes. She reports using eye drops about four to five times a day. She reports never had an issue with dry eyes. She denies dryness anywhere else. She is also experiencing tinging in her right leg from hip to knee a month ago. Patient reports it randomly happens. She has also been dizzy for a month. She reports experiencing dizziness 4 to 5 times a week but reports that it only happens for a few seconds. She reports that it happens when sitting at her computer at work. Patient reports dizziness does not occur with positional changes. Patient reports falling and injuring her right shoulder three weeks ago.She has tried Aleve and Motrin for right shoulder pain with no relief. Patient has not been able to sleep because of pain. She describes the shoulder pain as dull pain until she moves her arm, then the pain is sharp. She rates the pain a 6/10. Patient reports that she has been on Zoloft in the past for two years. Patient reports having occasional night terrors where she cannot go back to sleep. Patient denies suicidal or homicidal ideation.   Review of Systems See HPI     Objective:   Physical Exam  Constitutional: She is oriented to person, place, and time. She appears well-developed and well-nourished.  HENT:  Head: Normocephalic and atraumatic.  Right Ear: External ear normal.  Left Ear: External ear normal.  Mouth/Throat: Oropharynx is clear and moist.  Negative Dix-Hallpike   Cardiovascular: Normal rate, regular rhythm and normal heart sounds.  Exam reveals no gallop and no friction rub.    No murmur heard. Pulmonary/Chest: Effort normal and breath sounds normal. No respiratory distress. She has no wheezes. She has no rales.  Musculoskeletal: She exhibits tenderness (Tenderness to palpation of right AC joint and right acromion. ). She exhibits no edema or deformity.  Full active ROM of right and left shoulder. Patient experienced pain when moving right shoulder. Negative empty can, drop arm test and neer's.   Neurological: She is alert and oriented to person, place, and time. No cranial nerve deficit.  Psychiatric: She has a normal mood and affect. Her behavior is normal. Judgment and thought content normal.   PHQ-9 Score: 17      Assessment & Plan:   Jordin was seen today for depression.  Diagnoses and all orders for this visit:   Acute pain of right shoulder Suspect some bursitis -Prescribed diclofenac (VOLTAREN) 75 MG EC tablet; Take 1 tablet (75 mg total) by mouth 2 (two) times daily for shoulder pain.  - DG Shoulder Right if shoulder is still hurting in a week. And could consider sports medicine visit for possible injection.  -Advised patient to rest shoulder and use ice and heat.  -Follow-up if symptoms worsen.   Severe episode of recurrent major depressive disorder, without psychotic features ([place]) -Start traZODone (DESYREL) 50 MG tablet; Take 0.5-1 tablets (25-50 mg total) by mouth at bedtime as needed for sleep. Added this medication to help patient sleep at night.  -Continue sertraline (ZOLOFT) 50 MG tablet; Take 1 tablet (  50 mg total) by mouth daily since patient is well controlled on Zoloft and patient has been on Zoloft previously with no adverse effects. If dizziness, dry eyes and tingling persists, then will discontinue Zoloft.  -Follow-up as needed if symptoms worsen.   Dizziness       -Advised patient to try Flonase for sinuses which could be causing dizziness.       -If dizziness worsens, please call office and will make follow-up appointment to  re-evaluate.

## 2016-06-29 ENCOUNTER — Other Ambulatory Visit: Payer: Self-pay | Admitting: Osteopathic Medicine

## 2016-06-29 DIAGNOSIS — F332 Major depressive disorder, recurrent severe without psychotic features: Secondary | ICD-10-CM

## 2016-09-18 ENCOUNTER — Other Ambulatory Visit: Payer: Self-pay | Admitting: Physician Assistant

## 2016-09-18 DIAGNOSIS — F332 Major depressive disorder, recurrent severe without psychotic features: Secondary | ICD-10-CM

## 2016-09-20 ENCOUNTER — Other Ambulatory Visit: Payer: Self-pay | Admitting: *Deleted

## 2016-09-20 DIAGNOSIS — F332 Major depressive disorder, recurrent severe without psychotic features: Secondary | ICD-10-CM

## 2016-09-20 MED ORDER — TRAZODONE HCL 50 MG PO TABS: 25.0000 mg | ORAL_TABLET | Freq: Every evening | ORAL | 1 refills | Status: DC | PRN

## 2016-10-30 ENCOUNTER — Other Ambulatory Visit: Payer: Self-pay | Admitting: Obstetrics and Gynecology

## 2016-10-30 DIAGNOSIS — N644 Mastodynia: Secondary | ICD-10-CM

## 2016-11-14 ENCOUNTER — Other Ambulatory Visit: Payer: Self-pay | Admitting: Obstetrics and Gynecology

## 2016-11-14 DIAGNOSIS — N63 Unspecified lump in unspecified breast: Secondary | ICD-10-CM

## 2016-11-16 ENCOUNTER — Ambulatory Visit
Admission: RE | Admit: 2016-11-16 | Discharge: 2016-11-16 | Disposition: A | Discharge status: Home / Self Care | Payer: BLUE CROSS/BLUE SHIELD | Source: Ambulatory Visit | Attending: Obstetrics and Gynecology | Admitting: Obstetrics and Gynecology

## 2016-11-16 DIAGNOSIS — N63 Unspecified lump in unspecified breast: Secondary | ICD-10-CM

## 2017-01-24 ENCOUNTER — Other Ambulatory Visit: Payer: Self-pay | Admitting: Physician Assistant

## 2017-01-24 DIAGNOSIS — F332 Major depressive disorder, recurrent severe without psychotic features: Secondary | ICD-10-CM

## 2017-01-25 ENCOUNTER — Telehealth: Payer: Self-pay | Admitting: Physician Assistant

## 2017-01-25 NOTE — Telephone Encounter (Signed)
I called pt and left a message on VM cell for patient to call our office and schedule a F/u with Jade in August for Depression, migraines and Hypercolesterolemia

## 2017-02-15 ENCOUNTER — Encounter: Payer: Self-pay | Admitting: Physician Assistant

## 2017-02-15 ENCOUNTER — Ambulatory Visit (INDEPENDENT_AMBULATORY_CARE_PROVIDER_SITE_OTHER): Payer: BLUE CROSS/BLUE SHIELD | Admitting: Physician Assistant

## 2017-02-15 VITALS — BP 103/69 | HR 72 | Temp 98.0°F | Resp 18 | Wt 201.0 lb

## 2017-02-15 DIAGNOSIS — G43709 Chronic migraine without aura, not intractable, without status migrainosus: Secondary | ICD-10-CM

## 2017-02-15 DIAGNOSIS — R632 Polyphagia: Secondary | ICD-10-CM | POA: Insufficient documentation

## 2017-02-15 DIAGNOSIS — F331 Major depressive disorder, recurrent, moderate: Secondary | ICD-10-CM | POA: Diagnosis not present

## 2017-02-15 DIAGNOSIS — R635 Abnormal weight gain: Secondary | ICD-10-CM | POA: Insufficient documentation

## 2017-02-15 DIAGNOSIS — IMO0002 Reserved for concepts with insufficient information to code with codable children: Secondary | ICD-10-CM

## 2017-02-15 MED ORDER — SUMATRIPTAN SUCCINATE 50 MG PO TABS
50.0000 mg | ORAL_TABLET | ORAL | 3 refills | Status: DC | PRN
Start: 2017-02-15 — End: 2018-12-31

## 2017-02-15 MED ORDER — VILAZODONE HCL 10 & 20 MG PO KIT
1.0000 | PACK | Freq: Every morning | ORAL | 0 refills | Status: DC
Start: 2017-02-15 — End: 2017-03-14

## 2017-02-15 NOTE — Patient Instructions (Addendum)
For mood: - Taper down Zoloft to 30m nightly - At the same time start Viibryd 161mdaily  For migraines: - Sumatriptan 1 tablet at the first sign of headache - may repeat once in 2 hours if headache persists. Do not repeat more than twice in a 24 hour period  - Follow-up with [facility] in 4 weeks    Living With Depression Everyone experiences occasional disappointment, sadness, and loss in their lives. When you are feeling down, blue, or sad for at least 2 weeks in a row, it may mean that you have depression. Depression can affect your thoughts and feelings, relationships, daily activities, and physical health. It is caused by changes in the way your brain functions. If you receive a diagnosis of depression, your health care provider will tell you which type of depression you have and what treatment options are available to you. If you are living with depression, there are ways to help you recover from it and also ways to prevent it from coming back. How to cope with lifestyle changes Coping with stress Stress is your body's reaction to life changes and events, both good and bad. Stressful situations may include:  Getting married.  The death of a spouse.  Losing a job.  Retiring.  Having a baby.  Stress can last just a few hours or it can be ongoing. Stress can play a major role in depression, so it is important to learn both how to cope with stress and how to think about it differently. Talk with your health care provider or a counselor if you would like to learn more about stress reduction. He or she may suggest some stress reduction techniques, such as:  Music therapy. This can include creating music or listening to music. Choose music that you enjoy and that inspires you.  Mindfulness-based meditation. This kind of meditation can be done while sitting or walking. It involves being aware of your normal breaths, rather than trying to control your breathing.  Centering prayer. This  is a kind of meditation that involves focusing on a spiritual word or phrase. Choose a word, phrase, or sacred image that is meaningful to you and that brings you peace.  Deep breathing. To do this, expand your stomach and inhale slowly through your nose. Hold your breath for 3-5 seconds, then exhale slowly, allowing your stomach muscles to relax.  Muscle relaxation. This involves intentionally tensing muscles then relaxing them.  Choose a stress reduction technique that fits your lifestyle and personality. Stress reduction techniques take time and practice to develop. Set aside 5-15 minutes a day to do them. Therapists can offer training in these techniques. The training may be covered by some insurance plans. Other things you can do to manage stress include:  Keeping a stress diary. This can help you learn what triggers your stress and ways to control your response.  Understanding what your limits are and saying no to requests or events that lead to a schedule that is too full.  Thinking about how you respond to certain situations. You may not be able to control everything, but you can control how you react.  Adding humor to your life by watching funny films or TV shows.  Making time for activities that help you relax and not feeling guilty about spending your time this way.  Medicines Your health care provider may suggest certain medicines if he or she feels that they will help improve your condition. Avoid using alcohol and other substances that  may prevent your medicines from working properly (may interact). It is also important to:  Talk with your pharmacist or health care provider about all the medicines that you take, their possible side effects, and what medicines are safe to take together.  Make it your goal to take part in all treatment decisions (shared decision-making). This includes giving input on the side effects of medicines. It is best if shared decision-making with your  health care provider is part of your total treatment plan.  If your health care provider prescribes a medicine, you may not notice the full benefits of it for 4-8 weeks. Most people who are treated for depression need to be on medicine for at least 6-12 months after they feel better. If you are taking medicines as part of your treatment, do not stop taking medicines without first talking to your health care provider. You may need to have the medicine slowly decreased (tapered) over time to decrease the risk of harmful side effects. Relationships Your health care provider may suggest family therapy along with individual therapy and drug therapy. While there may not be family problems that are causing you to feel depressed, it is still important to make sure your family learns as much as they can about your mental health. Having your family's support can help make your treatment successful. How to recognize changes in your condition Everyone has a different response to treatment for depression. Recovery from major depression happens when you have not had signs of major depression for two months. This may mean that you will start to:  Have more interest in doing activities.  Feel less hopeless than you did 2 months ago.  Have more energy.  Overeat less often, or have better or improving appetite.  Have better concentration.  Your health care provider will work with you to decide the next steps in your recovery. It is also important to recognize when your condition is getting worse. Watch for these signs:  Having fatigue or low energy.  Eating too much or too little.  Sleeping too much or too little.  Feeling restless, agitated, or hopeless.  Having trouble concentrating or making decisions.  Having unexplained physical complaints.  Feeling irritable, angry, or aggressive.  Get help as soon as you or your family members notice these symptoms coming back. How to get support and help  from others How to talk with friends and family members about your condition Talking to friends and family members about your condition can provide you with one way to get support and guidance. Reach out to trusted friends or family members, explain your symptoms to them, and let them know that you are working with a health care provider to treat your depression. Financial resources Not all insurance plans cover mental health care, so it is important to check with your insurance carrier. If paying for co-pays or counseling services is a problem, search for a local or county mental health care center. They may be able to offer public mental health care services at low or no cost when you are not able to see a private health care provider. If you are taking medicine for depression, you may be able to get the generic form, which may be less expensive. Some makers of prescription medicines also offer help to patients who cannot afford the medicines they need. Follow these instructions at home:  Get the right amount and quality of sleep.  Cut down on using caffeine, tobacco, alcohol, and other potentially harmful  substances.  Try to exercise, such as walking or lifting small weights.  Take over-the-counter and prescription medicines only as told by your health care provider.  Eat a healthy diet that includes plenty of vegetables, fruits, whole grains, low-fat dairy products, and lean protein. Do not eat a lot of foods that are high in solid fats, added sugars, or salt.  Keep all follow-up visits as told by your health care provider. This is important. Contact a health care provider if:  You stop taking your antidepressant medicines, and you have any of these symptoms: ? Nausea. ? Headache. ? Feeling lightheaded. ? Chills and body aches. ? Not being able to sleep (insomnia).  You or your friends and family think your depression is getting worse. Get help right away if:  You have thoughts of  hurting yourself or others. If you ever feel like you may hurt yourself or others, or have thoughts about taking your own life, get help right away. You can go to your nearest emergency department or call:  Your local emergency services (911 in the [place]  A suicide crisis helpline, such as the [place] at 989 354 7850. This is open 24-hours a day.  Summary  If you are living with depression, there are ways to help you recover from it and also ways to prevent it from coming back.  Work with your health care team to create a management plan that includes counseling, stress management techniques, and healthy lifestyle habits. This information is not intended to replace advice given to you by your health care provider. Make sure you discuss any questions you have with your health care provider. Document Released: 06/05/2016 Document Revised: 06/05/2016 Document Reviewed: 06/05/2016 Elsevier Interactive Patient Education  [employer].

## 2017-02-15 NOTE — Progress Notes (Signed)
HPI:                                                                Cynthia [name] is a 46 y.o. female who presents to [place]: [place] today for 6 month follow-up of depression and migraines  Depression: patient has been taking Zoloft nightly for the last 6 months without difficulty. States it helps with sleep. Does report increased appetite and weight gain, which is upsetting to her. Self-discontinued Trazodone due to hypersomnolence. Has previously been on Wellbutrin, Zoloft, Paxil, see below. Denies symptoms of mania/hypomania. Denies suicidal thinking. Denies auditory/visual hallucinations.  Previous medications reviewed: Paxil, Wellbutrin, Zoloft, Trazodone Paxil didn't help and made her numb and caused weight gain Wellbutrin helped a bit Zoloft separately for about 2 years Trazodone, helped but oversedating  Migraines: reports they are much better. Reports she is having about 2 per month and they are less severe. Reports they are relieved by sleep. Tylenol helps a little.  Past Medical History:  Diagnosis Date  . Depression   . No pertinent past medical history    Past Surgical History:  Procedure Laterality Date  . BREAST BIOPSY Right 07/16/2013  . TONSILLECTOMY  1996  . [place] EXTRACTION  1990   Social History  Substance Use Topics  . Smoking status: Never Smoker  . Smokeless tobacco: Not on file  . Alcohol use Yes   family history includes Alcohol abuse in her paternal grandfather; Breast cancer in her maternal grandmother and sister; Cancer in her maternal grandmother; Diabetes in her father; Lupus in her sister; Parkinson's disease in her mother; Skin cancer in her father.  ROS: negative except as noted in the HPI  Medications: Current Outpatient Prescriptions  Medication Sig Dispense Refill  . SUMAtriptan (IMITREX) 50 MG tablet Take 1 tablet (50 mg total) by mouth every 2 (two) hours as needed for migraine.  May repeat in 2 hours if headache persists or recurs. 10 tablet 3  . Vilazodone HCl (VIIBRYD STARTER PACK) 10 & 20 MG KIT Take 1 tablet by mouth every morning. Use as directed, 4 weeks QS 1 kit 0   No current facility-administered medications for this visit.    Allergies  Allergen Reactions  . Promethazine Hcl     REACTION: convulsion       Objective:  BP 103/69   Pulse 72   Temp 98 F (36.7 C) (Oral)   Resp 18   Wt 201 lb (91.2 kg)   SpO2 98%   BMI 31.48 kg/m  Gen:  obese, not ill-appearing, no distress HEENT: normal conjunctiva, trachea midline Pulm: Normal work of breathing, normal phonation Neuro: alert and oriented x 3,  no tremor MSK: extremities atraumatic, normal gait and station Psych: well-groomed, cooperative, good eye contact, euthymic mood, affect mood-congruent, normal speech and thought content   No results found for this or any previous visit (from the past 72 hour(s)). No results found.  Depression screen PHQ 2/9 02/15/2017  Decreased Interest 3  Down, Depressed, Hopeless 0  PHQ - 2 Score 3  Altered sleeping 3  Tired, decreased energy 3  Change in appetite 3  Feeling bad or failure about yourself  2  Trouble concentrating 3  Moving slowly or fidgety/restless  0  Suicidal thoughts 0  PHQ-9 Score 17      Assessment and Plan: 46 y.o. female with  1. Moderate episode of recurrent major depressive disorder (HCC - PHQ9 score 17 today. Patient has failed Zoloft, Paxil, Wellbutrin and Trazodone - tapering down Zoloft to 2m nightly for 1 week. At the same time, starting Viibryd QAM - Vilazodone HCl (VIIBRYD STARTER PACK) 10 & 20 MG KIT; Take 1 tablet by mouth every morning. Use as directed, 4 weeks QS  Dispense: 1 kit; Refill: 0  2. Chronic migraine - discussed abortive therapy using triptan - SUMAtriptan (IMITREX) 50 MG tablet; Take 1 tablet (50 mg total) by mouth every 2 (two) hours as needed for migraine. May repeat in 2 hours if headache  persists or recurs.  Dispense: 10 tablet; Refill: 3  3. Abnormal weight gain Wt Readings from Last 3 Encounters:  02/15/17 201 lb (91.2 kg)  06/23/16 193 lb (87.5 kg)  04/14/16 190 lb (86.2 kg)  - has gained 11 pounds since starting Zoloft - hopefully this will improve with discontinuation of SSRI - follow-up with PCP in 4 weeks  4. Appetite increase  Patient education and anticipatory guidance given Patient agrees with treatment plan Follow-up in 4 weeks with PCP or sooner as needed   CDarlyne RussianPA-C

## 2017-02-19 ENCOUNTER — Telehealth: Payer: Self-pay | Admitting: *Deleted

## 2017-02-19 NOTE — Telephone Encounter (Signed)
Pre Authorization sent to cover my meds. Medication has been approved.Approvedtoday  CaseId:[id number];Status:Approved;Review Type:Prior Auth;Coverage Start Date:01/20/2017;Coverage End Date:02/19/2018;  patient and pharmacy notified

## 2017-03-14 ENCOUNTER — Ambulatory Visit (INDEPENDENT_AMBULATORY_CARE_PROVIDER_SITE_OTHER): Payer: BLUE CROSS/BLUE SHIELD | Admitting: Physician Assistant

## 2017-03-14 ENCOUNTER — Encounter: Payer: Self-pay | Admitting: Physician Assistant

## 2017-03-14 VITALS — BP 118/73 | HR 80 | Temp 98.0°F | Wt 201.0 lb

## 2017-03-14 DIAGNOSIS — Z6830 Body mass index (BMI) 30.0-30.9, adult: Secondary | ICD-10-CM | POA: Diagnosis not present

## 2017-03-14 DIAGNOSIS — Z23 Encounter for immunization: Secondary | ICD-10-CM | POA: Diagnosis not present

## 2017-03-14 DIAGNOSIS — G43709 Chronic migraine without aura, not intractable, without status migrainosus: Secondary | ICD-10-CM

## 2017-03-14 DIAGNOSIS — F331 Major depressive disorder, recurrent, moderate: Secondary | ICD-10-CM

## 2017-03-14 DIAGNOSIS — IMO0002 Reserved for concepts with insufficient information to code with codable children: Secondary | ICD-10-CM

## 2017-03-14 MED ORDER — LORCASERIN HCL 10 MG PO TABS: ORAL_TABLET | ORAL | 1 refills | Status: DC

## 2017-03-14 MED ORDER — BUPROPION HCL ER (XL) 150 MG PO TB24: 150.0000 mg | ORAL_TABLET | ORAL | 0 refills | Status: DC

## 2017-03-14 NOTE — Progress Notes (Signed)
Subjective:    Patient ID: Cynthia [name], female    DOB: September 18, 1970, 46 y.o.   MRN: [id number]  HPI Pt is a 46 yo female who presents to the clinic to follow up on medication switch. She was changed from zoloft to viibryd. She has tolerated medication ok and has started to "feel things more" but also has made her more "anxious and irritable". She does not really feel like it has helped a lot. She did start to exercise more but weight has not changed. She is really depressed about her weight. She feels like if she can get weight off she would be happier. She denies any suicidal thinking or hypomania/mania episodes.   .. Active Ambulatory Problems    Diagnosis Date Noted  . HYPERCHOLESTEROLEMIA 04/24/2006  . Major depressive disorder, recurrent episode ([place]) 04/24/2006  . Chronic migraine 02/23/2015  . Bad dreams 02/23/2015  . Abnormal weight gain 02/15/2017  . Appetite increase 02/15/2017  . BMI 30.0-30.9,adult 03/14/2017   Resolved Ambulatory Problems    Diagnosis Date Noted  . INFLUENZA, WITH RESPIRATORY SYMPTOMS 04/09/2008   Past Medical History:  Diagnosis Date  . Depression   . No pertinent past medical history       Review of Systems  All other systems reviewed and are negative.      Objective:   Physical Exam  Constitutional: She is oriented to person, place, and time. She appears well-developed and well-nourished.  Overweight.   HENT:  Head: Normocephalic and atraumatic.  Cardiovascular: Normal rate, regular rhythm and normal heart sounds.   Pulmonary/Chest: Effort normal and breath sounds normal.  Neurological: She is alert and oriented to person, place, and time.  Psychiatric: She has a normal mood and affect. Her behavior is normal.          Assessment & Plan:  Marland KitchenMarland KitchenJanari was seen today for depression.  Diagnoses and all orders for this visit:  Moderate episode of recurrent major depressive disorder (HCC) -     buPROPion (WELLBUTRIN XL) 150 MG  24 hr tablet; Take 1 tablet (150 mg total) by mouth every morning.  Need for influenza vaccination -     Flu Vaccine QUAD 6+ mos PF IM (Fluarix Quad PF)  Chronic migraine  BMI 30.0-30.9,adult -     Lorcaserin HCl 10 MG TABS; Take one tablet twice a day with meals.  Other orders -     Cancel: Vilazodone HCl (VIIBRYD) 20 MG TABS; Take 1 tablet (20 mg total) by mouth daily.   .. Depression screen [facility] 2/9 03/14/2017 02/15/2017  Decreased Interest 2 3  Down, Depressed, Hopeless 1 0  PHQ - 2 Score 3 3  Altered sleeping 3 3  Tired, decreased energy 2 3  Change in appetite 3 3  Feeling bad or failure about yourself  2 2  Trouble concentrating 3 3  Moving slowly or fidgety/restless 3 0  Suicidal thoughts 0 0  PHQ-9 Score 19 17  Difficult doing work/chores Very difficult -   .. GAD 7 : Generalized Anxiety Score 03/14/2017  Nervous, Anxious, on Edge 2  Control/stop worrying 1  Worry too much - different things 2  Trouble relaxing 2  Restless 1  Easily annoyed or irritable 2  Afraid - awful might happen 0  Total GAD 7 Score 10  Anxiety Difficulty Somewhat difficult    viibryd does not seem to be helping due to numbers and could be making anxiety worse. Will try wellbutrin.  Follow up in 4-6  weeks.   Marland Kitchen.Discussed low carb diet with 1500 calories and 80g of protein.  Exercising at least 150 minutes a week.  [employer]itness Pal could be a Microbiologist.  Started belviq. Discussed side effects.   Migraines are well controlled.

## 2017-03-14 NOTE — Patient Instructions (Signed)
.  Discussed low carb diet with 1500 calories and 80g of protein.  Exercising at least 150 minutes a week.  [employer]itness Pal could be a Microbiologist.  Lose it.

## 2017-03-15 ENCOUNTER — Telehealth: Payer: Self-pay | Admitting: Physician Assistant

## 2017-03-15 NOTE — Telephone Encounter (Signed)
Received fax for PA on Belviq sent through cover my meds and received authorization.  Case ID [id number]  Valid:  02/13/17 - 06/13/17  I will notify the pharmacy - CF

## 2017-03-16 ENCOUNTER — Encounter: Payer: Self-pay | Admitting: Physician Assistant

## 2017-03-29 ENCOUNTER — Telehealth: Payer: Self-pay | Admitting: *Deleted

## 2017-03-29 NOTE — Telephone Encounter (Signed)
Patient called to let you know that she cut the viibryd in half and is taking just 10 mg. She feels this is working. 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

## 2017-03-29 NOTE — Telephone Encounter (Signed)
Ok please make sure documented in medications so we have it right.

## 2017-04-03 ENCOUNTER — Other Ambulatory Visit: Payer: Self-pay | Admitting: Physician Assistant

## 2017-04-04 ENCOUNTER — Encounter: Payer: Self-pay | Admitting: Physician Assistant

## 2017-04-05 ENCOUNTER — Other Ambulatory Visit: Payer: Self-pay | Admitting: Physician Assistant

## 2017-04-05 ENCOUNTER — Encounter: Payer: Self-pay | Admitting: *Deleted

## 2017-04-05 MED ORDER — VILAZODONE HCL 10 MG PO TABS: 10.0000 mg | ORAL_TABLET | Freq: Every day | ORAL | 1 refills | Status: DC

## 2017-05-01 ENCOUNTER — Other Ambulatory Visit: Payer: Self-pay | Admitting: *Deleted

## 2017-05-01 DIAGNOSIS — F331 Major depressive disorder, recurrent, moderate: Secondary | ICD-10-CM

## 2017-05-01 MED ORDER — BUPROPION HCL ER (XL) 150 MG PO TB24: 150.0000 mg | ORAL_TABLET | ORAL | 0 refills | Status: DC

## 2017-05-14 ENCOUNTER — Ambulatory Visit (INDEPENDENT_AMBULATORY_CARE_PROVIDER_SITE_OTHER): Payer: BLUE CROSS/BLUE SHIELD | Admitting: Physician Assistant

## 2017-05-14 ENCOUNTER — Encounter: Payer: Self-pay | Admitting: Physician Assistant

## 2017-05-14 VITALS — BP 105/61 | HR 83 | Ht 67.0 in | Wt 178.0 lb

## 2017-05-14 DIAGNOSIS — F331 Major depressive disorder, recurrent, moderate: Secondary | ICD-10-CM | POA: Diagnosis not present

## 2017-05-14 DIAGNOSIS — Z6827 Body mass index (BMI) 27.0-27.9, adult: Secondary | ICD-10-CM

## 2017-05-14 MED ORDER — BUPROPION HCL ER (XL) 150 MG PO TB24: 150.0000 mg | ORAL_TABLET | ORAL | 5 refills | Status: DC

## 2017-05-14 MED ORDER — LORCASERIN HCL 10 MG PO TABS: ORAL_TABLET | ORAL | 5 refills | Status: DC

## 2017-05-14 MED ORDER — VILAZODONE HCL 10 MG PO TABS: ORAL_TABLET | ORAL | 5 refills | Status: DC

## 2017-05-14 NOTE — Progress Notes (Signed)
Subjective:    Patient ID: Cynthia [name], female    DOB: 12-Jun-1971, 46 y.o.   MRN: [id number]  HPI  Patient is a 46 year old female who presents to the clinic to follow-up on belviq.  She is done great on this additional therapy.  In the last 2 months she is down 23 pounds.  She denies any side effects.  She is walking every day at least 30 minutes.  She is very happy with her results.  She is trying to keep to a 1300-calorie diet.  She is also here to follow-up on Wellbutrin and Viibryd in combination with each other.  She is taking 5 mg of Viibryd.  She feels like this is doing great to control her mood.  She denies any suicidal homicidal thoughts.  .. Active Ambulatory Problems    Diagnosis Date Noted  . HYPERCHOLESTEROLEMIA 04/24/2006  . Major depressive disorder, recurrent episode ([place]) 04/24/2006  . Chronic migraine 02/23/2015  . Bad dreams 02/23/2015  . Abnormal weight gain 02/15/2017  . Appetite increase 02/15/2017  . BMI 30.0-30.9,adult 03/14/2017   Resolved Ambulatory Problems    Diagnosis Date Noted  . INFLUENZA, WITH RESPIRATORY SYMPTOMS 04/09/2008   Past Medical History:  Diagnosis Date  . Depression   . No pertinent past medical history         Review of Systems  All other systems reviewed and are negative.      Objective:   Physical Exam  Constitutional: She is oriented to person, place, and time. She appears well-developed and well-nourished.  HENT:  Head: Normocephalic and atraumatic.  Cardiovascular: Normal rate, regular rhythm and normal heart sounds.   Pulmonary/Chest: Effort normal and breath sounds normal.  Neurological: She is alert and oriented to person, place, and time.  Psychiatric: She has a normal mood and affect. Her behavior is normal.          Assessment & Plan:  Marland KitchenMarland KitchenDashanna was seen today for depression and obesity.  Diagnoses and all orders for this visit:  BMI 27.0-27.9,adult -     Lorcaserin HCl 10 MG TABS; Take  one tablet twice a day with meals.  Moderate episode of recurrent major depressive disorder (HCC) -     Vilazodone HCl (VIIBRYD) 10 MG TABS; Take 1/2 tablet daily. -     buPROPion (WELLBUTRIN XL) 150 MG 24 hr tablet; Take 1 tablet (150 mg total) by mouth every morning.    Depression screen [facility] 2/9 05/14/2017 03/14/2017 02/15/2017  Decreased Interest 1 2 3   Down, Depressed, Hopeless 0 1 0  PHQ - 2 Score 1 3 3   Altered sleeping 1 3 3   Tired, decreased energy 1 2 3   Change in appetite 0 3 3  Feeling bad or failure about yourself  0 2 2  Trouble concentrating 2 3 3   Moving slowly or fidgety/restless 0 3 0  Suicidal thoughts 0 0 0  PHQ-9 Score 5 19 17   Difficult doing work/chores - Very difficult -   .. GAD 7 : Generalized Anxiety Score 05/14/2017 03/14/2017  Nervous, Anxious, on Edge 0 2  Control/stop worrying 0 1  Worry too much - different things 1 2  Trouble relaxing 1 2  Restless 0 1  Easily annoyed or irritable 0 2  Afraid - awful might happen 0 0  Total GAD 7 Score 2 10  Anxiety Difficulty - Somewhat difficult    Patient's screening PHQ 9 and GAD 7 have improved significantly.  Continue on Wellbutrin 150 mg  and 1/2 tablet of thyroid 10 mg daily.  Follow-up in 6 months.  I am so impressed with her weight loss.  Continue Belviq.  She has lost over 10% of her body weight in 2 months.  Her goal is 150 pounds.  Follow-up in 6 months.

## 2017-06-11 ENCOUNTER — Encounter: Payer: Self-pay | Admitting: Physician Assistant

## 2017-06-12 ENCOUNTER — Other Ambulatory Visit: Payer: Self-pay | Admitting: *Deleted

## 2017-06-12 DIAGNOSIS — F331 Major depressive disorder, recurrent, moderate: Secondary | ICD-10-CM

## 2017-06-12 MED ORDER — BUPROPION HCL ER (XL) 150 MG PO TB24
150.0000 mg | ORAL_TABLET | ORAL | 0 refills | Status: DC
Start: 2017-06-12 — End: 2017-08-27

## 2017-06-12 MED ORDER — BUPROPION HCL ER (XL) 150 MG PO TB24: 150.0000 mg | ORAL_TABLET | ORAL | 0 refills | Status: DC

## 2017-06-12 MED ORDER — VILAZODONE HCL 10 MG PO TABS: ORAL_TABLET | ORAL | 0 refills | Status: DC

## 2017-06-12 NOTE — Addendum Note (Signed)
Addended by: Narda Rutherford on: 06/12/2017 01:40 PM   Modules accepted: Orders

## 2017-06-20 ENCOUNTER — Encounter: Payer: Self-pay | Admitting: Physician Assistant

## 2017-06-20 DIAGNOSIS — Z6827 Body mass index (BMI) 27.0-27.9, adult: Secondary | ICD-10-CM

## 2017-06-21 ENCOUNTER — Telehealth: Payer: Self-pay | Admitting: *Deleted

## 2017-06-21 MED ORDER — LORCASERIN HCL 10 MG PO TABS: ORAL_TABLET | ORAL | 4 refills | Status: DC

## 2017-06-21 NOTE — Telephone Encounter (Signed)
Pre Authorization sent to cover my meds. Approvedtoday  CaseId:[id number];Status:Approved;Review Type:Prior Auth;Coverage Start Date:05/22/2017;Coverage End Date:06/21/2018;  pharmacy notified via fax

## 2017-08-24 ENCOUNTER — Other Ambulatory Visit: Payer: Self-pay | Admitting: Physician Assistant

## 2017-08-24 DIAGNOSIS — F331 Major depressive disorder, recurrent, moderate: Secondary | ICD-10-CM

## 2017-10-24 ENCOUNTER — Emergency Department (INDEPENDENT_AMBULATORY_CARE_PROVIDER_SITE_OTHER)
Admission: EM | Admit: 2017-10-24 | Discharge: 2017-10-24 | Disposition: A | Discharge status: Home / Self Care | Payer: BLUE CROSS/BLUE SHIELD | Source: Home / Self Care | Attending: Emergency Medicine | Admitting: Emergency Medicine

## 2017-10-24 ENCOUNTER — Emergency Department
Admission: EM | Admit: 2017-10-24 | Discharge: 2017-10-24 | Discharge status: Absent without leave | Payer: BLUE CROSS/BLUE SHIELD | Source: Home / Self Care

## 2017-10-24 ENCOUNTER — Other Ambulatory Visit: Payer: Self-pay

## 2017-10-24 DIAGNOSIS — N3001 Acute cystitis with hematuria: Secondary | ICD-10-CM

## 2017-10-24 LAB — POCT URINALYSIS DIP (MANUAL ENTRY)
Bilirubin, UA: NEGATIVE
GLUCOSE UA: NEGATIVE mg/dL
Ketones, POC UA: NEGATIVE mg/dL
NITRITE UA: NEGATIVE
PROTEIN UA: NEGATIVE mg/dL
Spec Grav, UA: 1.02 (ref 1.010–1.025)
UROBILINOGEN UA: 0.2 U/dL
pH, UA: 6 (ref 5.0–8.0)

## 2017-10-24 MED ORDER — SULFAMETHOXAZOLE-TRIMETHOPRIM 800-160 MG PO TABS: 1.0000 | ORAL_TABLET | Freq: Two times a day (BID) | ORAL | 0 refills | Status: AC

## 2017-10-24 NOTE — ED Provider Notes (Signed)
[facility]    CSN: [id number] Arrival date & time: 10/24/17  [place]     History   Chief Complaint Chief Complaint  Patient presents with  . Urinary Tract Infection    HPI Cynthia [name] is a 47 y.o. female.   HPI This is a 47 y.o. female who presents today with UTI symptoms for 3 days + dysuria + frequency + urgency No hematuria No vaginal discharge No fever/chills No lower abdominal pain No nausea No vomiting No back pain No fatigue She denies chance of pregnancy.-On BCPs.  Last menstrual period 09/24/17. Has tried over-the-counter measures without improvement. She states this is her third UTI in the past year.  In the past had negative urology workup. Denies history of kidney stones. Past Medical History:  Diagnosis Date  . Depression   . No pertinent past medical history     Patient Active Problem List   Diagnosis Date Noted  . BMI 30.0-30.9,adult 03/14/2017  . Abnormal weight gain 02/15/2017  . Appetite increase 02/15/2017  . Chronic migraine 02/23/2015  . Bad dreams 02/23/2015  . HYPERCHOLESTEROLEMIA 04/24/2006  . Major depressive disorder, recurrent episode ([place]) 04/24/2006    Past Surgical History:  Procedure Laterality Date  . BREAST BIOPSY Right 07/16/2013  . TONSILLECTOMY  1996  . WISDOM TOOTH EXTRACTION  1990    OB History    Gravida  4   Para  3   Term  3   Preterm  0   AB  1   Living  3     SAB  1   TAB  0   Ectopic  0   Multiple  0   Live Births  1            Home Medications    Prior to Admission medications   Medication Sig Start Date End Date Taking? Authorizing Provider  buPROPion (WELLBUTRIN XL) 150 MG 24 hr tablet TAKE 1 TABLET EVERY MORNING 08/27/17   Breeback, Jade L, PA-C  Lorcaserin HCl 10 MG TABS Take one tablet twice a day with meals. 06/21/17   Breeback, Jade L, PA-C  sulfamethoxazole-trimethoprim (BACTRIM DS,SEPTRA DS) 800-160 MG tablet Take 1 tablet by mouth 2 (two) times daily for  7 days. 10/24/17 10/31/17  Jacqulyn Cane, MD  SUMAtriptan (IMITREX) 50 MG tablet Take 1 tablet (50 mg total) by mouth every 2 (two) hours as needed for migraine. May repeat in 2 hours if headache persists or recurs. 02/15/17   Trixie Dredge, PA-C  Vilazodone HCl (VIIBRYD) 10 MG TABS Take 1/2 tablet daily. 06/12/17   Donella Stade, PA-C    Family History Family History  Problem Relation Age of Onset  . Diabetes Father   . Skin cancer Father   . Cancer Maternal Grandmother   . Breast cancer Maternal Grandmother   . Alcohol abuse Paternal Grandfather   . Parkinson's disease Mother   . Lupus Sister   . Breast cancer Sister     Social History Social History   Tobacco Use  . Smoking status: Never Smoker  . Smokeless tobacco: Never Used  Substance Use Topics  . Alcohol use: Yes  . Drug use: No     Allergies   Promethazine hcl   Review of Systems Review of Systems  All other systems reviewed and are negative.  See HPI  Physical Exam Triage Vital Signs ED Triage Vitals [10/24/17 1841]  Enc Vitals Group     BP 116/77  Pulse Rate 70     Resp 18     Temp 98.6 F (37 C)     Temp Source Oral     SpO2 98 %     Weight 177 lb (80.3 kg)     Height      Head Circumference      Peak Flow      Pain Score 0     Pain Loc      Pain Edu?      Excl. in [place]?    No data found.  Updated Vital Signs BP 116/77 (BP Location: Right Arm)   Pulse 70   Temp 98.6 F (37 C) (Oral)   Resp 18   Wt 177 lb (80.3 kg)   LMP 09/24/2017 (Exact Date)   SpO2 98%   BMI 27.72 kg/m   Visual Acuity Right Eye Distance:   Left Eye Distance:   Bilateral Distance:    Right Eye Near:   Left Eye Near:    Bilateral Near:     Physical Exam  Constitutional: She is oriented to person, place, and time. She appears well-developed and well-nourished. No distress.  HENT:  Mouth/Throat: Oropharynx is clear and moist.  Eyes: No scleral icterus.  Neck: Neck supple.  Cardiovascular:  Normal rate, regular rhythm and normal heart sounds.  Pulmonary/Chest: Breath sounds normal.  Abdominal: Soft. She exhibits no mass. There is no hepatosplenomegaly. There is tenderness in the suprapubic area. There is no rebound, no guarding and no CVA tenderness.  Lymphadenopathy:    She has no cervical adenopathy.  Neurological: She is alert and oriented to person, place, and time.  Skin: Skin is warm and dry.  Nursing note and vitals reviewed.    UC Treatments / Results  Labs (all labs ordered are listed, but only abnormal results are displayed) Labs Reviewed - No data to display  EKG None Radiology No results found.  Procedures Procedures (including critical care time)  Medications Ordered in UC Medications - No data to display   Initial Impression / Assessment and Plan / UC Course  I have reviewed the triage vital signs and the nursing notes.  Pertinent labs & imaging results that were available during my care of the patient were reviewed by me and considered in my medical decision making (see chart for details).     UA positive for leukocytes and blood. Urine culture sent.  Final Clinical Impressions(s) / UC Diagnoses   Final diagnoses:  Acute cystitis with hematuria   She denies chance of pregnancy.  LMP 09/24/2017.  On OCP. After risk benefits alternatives discussed, prescribed Septra DS twice daily times 7 days. Urine culture sent. Other advice given, symptomatic care discussed. Advised to follow-up with PCP in 7 days for recheck, sooner if worse or new symptoms. Advised her to discuss with PCP whether or not to refer to urologist or whether or not to try low-dose Macrodantin for prevention of UTI., as this is her third UTI in the past year, and she states years ago she was on Macrodantin daily for prophylaxis. -She states she had full urology workup in the past which was negative.  She voiced understanding and agreement with the above plans. ED Discharge  Orders        Ordered    sulfamethoxazole-trimethoprim (BACTRIM DS,SEPTRA DS) 800-160 MG tablet  2 times daily     10/24/17 1910        Jacqulyn Cane, MD 10/25/17 1130

## 2017-10-24 NOTE — ED Triage Notes (Signed)
Pt c/o of UTI symptoms since Monday. Has history of frequent UTIs.

## 2017-10-26 LAB — URINE CULTURE
MICRO NUMBER:: 90444296
SPECIMEN QUALITY:: ADEQUATE

## 2017-10-27 ENCOUNTER — Telehealth: Payer: Self-pay

## 2017-10-27 NOTE — Telephone Encounter (Signed)
Left msg for pt with culture results. Informed pt to dc Bactrim and pick up new Rx from [facility] for Cipro. Advised pt to call back if any questions.

## 2017-10-27 NOTE — ED Notes (Signed)
Pts urine culture showed resistance to Bactrim. Called in new Rx to [facility] for Cipro 500 mg #14. Take BID x 7 days.

## 2017-11-16 ENCOUNTER — Other Ambulatory Visit: Payer: Self-pay | Admitting: Physician Assistant

## 2017-11-16 DIAGNOSIS — F331 Major depressive disorder, recurrent, moderate: Secondary | ICD-10-CM

## 2018-01-02 ENCOUNTER — Ambulatory Visit: Payer: BLUE CROSS/BLUE SHIELD | Admitting: Physician Assistant

## 2018-01-02 ENCOUNTER — Encounter: Payer: Self-pay | Admitting: Physician Assistant

## 2018-01-02 VITALS — BP 114/76 | HR 84 | Ht 66.0 in | Wt 179.0 lb

## 2018-01-02 DIAGNOSIS — Z1231 Encounter for screening mammogram for malignant neoplasm of breast: Secondary | ICD-10-CM | POA: Diagnosis not present

## 2018-01-02 DIAGNOSIS — F331 Major depressive disorder, recurrent, moderate: Secondary | ICD-10-CM

## 2018-01-02 DIAGNOSIS — Z0000 Encounter for general adult medical examination without abnormal findings: Secondary | ICD-10-CM | POA: Diagnosis not present

## 2018-01-02 DIAGNOSIS — Z131 Encounter for screening for diabetes mellitus: Secondary | ICD-10-CM | POA: Diagnosis not present

## 2018-01-02 DIAGNOSIS — Z13220 Encounter for screening for lipoid disorders: Secondary | ICD-10-CM

## 2018-01-02 DIAGNOSIS — R635 Abnormal weight gain: Secondary | ICD-10-CM

## 2018-01-02 DIAGNOSIS — E663 Overweight: Secondary | ICD-10-CM | POA: Insufficient documentation

## 2018-01-02 DIAGNOSIS — Z6827 Body mass index (BMI) 27.0-27.9, adult: Secondary | ICD-10-CM

## 2018-01-02 LAB — COMPLETE METABOLIC PANEL WITH GFR
AG Ratio: 2 (calc) (ref 1.0–2.5)
ALKALINE PHOSPHATASE (APISO): 84 U/L (ref 33–115)
ALT: 14 U/L (ref 6–29)
AST: 13 U/L (ref 10–35)
Albumin: 4.1 g/dL (ref 3.6–5.1)
BILIRUBIN TOTAL: 0.4 mg/dL (ref 0.2–1.2)
BUN: 16 mg/dL (ref 7–25)
CHLORIDE: 108 mmol/L (ref 98–110)
CO2: 24 mmol/L (ref 20–32)
CREATININE: 0.92 mg/dL (ref 0.50–1.10)
Calcium: 9.2 mg/dL (ref 8.6–10.2)
GFR, Est African American: 87 mL/min/{1.73_m2} (ref >=60)
GFR, Est Non African American: 75 mL/min/{1.73_m2} (ref >=60)
GLUCOSE: 89 mg/dL (ref 65–99)
Globulin: 2.1 g/dL (calc) (ref 1.9–3.7)
Potassium: 4.3 mmol/L (ref 3.5–5.3)
SODIUM: 140 mmol/L (ref 135–146)
Total Protein: 6.2 g/dL (ref 6.1–8.1)

## 2018-01-02 LAB — LIPID PANEL W/REFLEX DIRECT LDL
CHOL/HDL RATIO: 3.9 (calc) (ref <5.0)
Cholesterol: 196 mg/dL (ref <200)
HDL: 50 mg/dL — ABNORMAL LOW (ref >50)
LDL CHOLESTEROL (CALC): 124 mg/dL — AB
Non-HDL Cholesterol (Calc): 146 mg/dL (calc) — ABNORMAL HIGH (ref <130)
Triglycerides: 112 mg/dL (ref <150)

## 2018-01-02 LAB — CBC
HEMATOCRIT: 40.2 % (ref 35.0–45.0)
Hemoglobin: 13.6 g/dL (ref 11.7–15.5)
MCH: 28.5 pg (ref 27.0–33.0)
MCHC: 33.8 g/dL (ref 32.0–36.0)
MCV: 84.3 fL (ref 80.0–100.0)
MPV: 10.4 fL (ref 7.5–12.5)
Platelets: 313 10*3/uL (ref 140–400)
RBC: 4.77 10*6/uL (ref 3.80–5.10)
RDW: 12.6 % (ref 11.0–15.0)
WBC: 7.7 10*3/uL (ref 3.8–10.8)

## 2018-01-02 LAB — TSH: TSH: 3.03 m[IU]/L

## 2018-01-02 MED ORDER — VILAZODONE HCL 10 MG PO TABS: 5.0000 mg | ORAL_TABLET | Freq: Every day | ORAL | 4 refills | Status: DC

## 2018-01-02 MED ORDER — LORCASERIN HCL 10 MG PO TABS: ORAL_TABLET | ORAL | 4 refills | Status: DC

## 2018-01-02 MED ORDER — BUPROPION HCL ER (XL) 150 MG PO TB24: 150.0000 mg | ORAL_TABLET | Freq: Every morning | ORAL | 4 refills | Status: DC

## 2018-01-02 NOTE — Progress Notes (Signed)
Subjective:    Patient ID: Cynthia [name], female    DOB: 05/11/1971, 47 y.o.   MRN: [id number]  HPI  Patient is a 47 yo female who presents to the clinic for refill of medications. She is one Viibryd and Wellbutrin for depression and states she is happy with her response to these medications. She has been sleeping better, no longer waking up with nightmares. She is on Belviq for weight loss since October 2018 with good results until February when she started gaining weight again. She ran out of medication 3-4 weeks ago. She states she has felt her appetite returning but has been maintaining her diet. She has eliminated soda and cut down on bread and cheese. She is walking more for exercise.   She also has spot on her forehead for a couple months that is scaly and tender.  No CP, SHOB, swelling, constipation, or diarrhea.  Review of Systems  All other systems reviewed and are negative.  .. Active Ambulatory Problems    Diagnosis Date Noted  . HYPERCHOLESTEROLEMIA 04/24/2006  . Major depressive disorder, recurrent episode ([place]) 04/24/2006  . Chronic migraine 02/23/2015  . Bad dreams 02/23/2015  . Abnormal weight gain 02/15/2017  . Appetite increase 02/15/2017  . BMI 30.0-30.9,adult 03/14/2017  . BMI 27.0-27.9,adult 01/02/2018   Resolved Ambulatory Problems    Diagnosis Date Noted  . INFLUENZA, WITH RESPIRATORY SYMPTOMS 04/09/2008   Past Medical History:  Diagnosis Date  . Depression   . No pertinent past medical history    .Marland Kitchen Family History  Problem Relation Age of Onset  . Diabetes Father   . Skin cancer Father   . Cancer Maternal Grandmother   . Breast cancer Maternal Grandmother   . Alcohol abuse Paternal Grandfather   . Parkinson's disease Mother   . Lupus Sister   . Breast cancer Sister        Objective:   Physical Exam  Constitutional: She appears well-developed and well-nourished. No distress.  HENT:  Head: Normocephalic and atraumatic.    Cardiovascular: Normal rate and regular rhythm. Exam reveals no gallop and no friction rub.  No murmur heard. Pulmonary/Chest: Effort normal and breath sounds normal.  Skin: Skin is warm and dry. She is not diaphoretic.     Psychiatric: She has a normal mood and affect. Her behavior is normal.   .. Vitals:   01/02/18 0754  BP: 114/76  Pulse: 84  SpO2: 98%       Assessment & Plan:  Marland KitchenMarland KitchenCarel was seen today for follow-up and weight loss.  Diagnoses and all orders for this visit:  Preventative health care -     MM 3D SCREEN BREAST BILATERAL -     Lipid Panel w/reflex Direct LDL -     COMPLETE METABOLIC PANEL WITH GFR -     CBC -     TSH  Moderate episode of recurrent major depressive disorder (HCC) -     buPROPion (WELLBUTRIN XL) 150 MG 24 hr tablet; Take 1 tablet (150 mg total) by mouth every morning. -     Vilazodone HCl (VIIBRYD) 10 MG TABS; Take 0.5 tablets (5 mg total) by mouth daily.  BMI 27.0-27.9,adult -     Lorcaserin HCl 10 MG TABS; Take one tablet twice a day with meals.  Screening for lipid disorders -     Lipid Panel w/reflex Direct LDL  Screening for diabetes mellitus -     COMPLETE METABOLIC PANEL WITH GFR  Visit for screening mammogram -  MM 3D SCREEN BREAST BILATERAL  Weight gain -     CBC -     TSH     .Marland Kitchen Depression screen [facility] 2/9 01/02/2018 05/14/2017 03/14/2017 02/15/2017  Decreased Interest 1 1 2 3   Down, Depressed, Hopeless 0 0 1 0  PHQ - 2 Score 1 1 3 3   Altered sleeping 1 1 3 3   Tired, decreased energy 2 1 2 3   Change in appetite 2 0 3 3  Feeling bad or failure about yourself  - 0 2 2  Trouble concentrating 1 2 3 3   Moving slowly or fidgety/restless 0 0 3 0  Suicidal thoughts 0 0 0 0  PHQ-9 Score 7 5 19 17   Difficult doing work/chores Somewhat difficult - Very difficult -   .. GAD 7 : Generalized Anxiety Score 01/02/2018 05/14/2017 03/14/2017  Nervous, Anxious, on Edge 0 0 2  Control/stop worrying 1 0 1  Worry too much -  different things 1 1 2   Trouble relaxing 1 1 2   Restless 0 0 1  Easily annoyed or irritable 0 0 2  Afraid - awful might happen 1 0 0  Total GAD 7 Score 4 2 10   Anxiety Difficulty Somewhat difficult - Somewhat difficult    Weight loss - Overall weight loss on Belviq though some recent weight gain. Restart Belviq - hopeful that break from the medication will lead to continued results. Continue lifestyle changes. Will check CBC and thyroid.  Skin lesion - AK vs SK vs seborrheic dermatitis. Recommend selsun blue for now. Discussed tenderness may be due to irritating the site as she scratches it frequently. Will follow up if lesion changes or grows. Discussed potential future management with cryotherapy.  Depression well controlled. Medication refilled today.   Screening labs today. Mammogram ordered.

## 2018-01-02 NOTE — Progress Notes (Signed)
Call pt: thyroid good. No anemia. No diabetes. Kidney and liver look good. Good cholesterol looks good. Bad cholesterol is at goal but could be better. Continue to work on increasing good fats and decreasing bad fats. Keep exercising. Labs look good.

## 2018-01-02 NOTE — Patient Instructions (Signed)
Seborrheic Dermatitis, Adult Seborrheic dermatitis is a skin disease that causes red, scaly patches. It usually occurs on the scalp, and it is often called dandruff. The patches may appear on other parts of the body. Skin patches tend to appear where there are many oil glands in the skin. Areas of the body that are commonly affected include:  Scalp.  Skin folds of the body.  Ears.  Eyebrows.  Neck.  Face.  Armpits.  The bearded area of men's faces.  The condition may come and go for no known reason, and it is often long-lasting (chronic). What are the causes? The cause of this condition is not known. What increases the risk? This condition is more likely to develop in people who:  Have certain conditions, such as: ? HIV (human immunodeficiency virus). ? AIDS (acquired immunodeficiency syndrome). ? Parkinson disease. ? Mood disorders, such as depression.  Are 76-68 years old.  What are the signs or symptoms? Symptoms of this condition include:  Thick scales on the scalp.  Redness on the face or in the armpits.  Skin that is flaky. The flakes may be white or yellow.  Skin that seems oily or dry but is not helped with moisturizers.  Itching or burning in the affected areas.  How is this diagnosed? This condition is diagnosed with a medical history and physical exam. A sample of your skin may be tested (skin biopsy). You may need to see a skin specialist (dermatologist). How is this treated? There is no cure for this condition, but treatment can help to manage the symptoms. You may get treatment to remove scales, lower the risk of skin infection, and reduce swelling or itching. Treatment may include:  Creams that reduce swelling and irritation (steroids).  Creams that reduce skin yeast.  Medicated shampoo, soaps, moisturizing creams, or ointments.  Medicated moisturizing creams or ointments.  Follow these instructions at home:  Apply over-the-counter and  prescription medicines only as told by your health care provider.  Use any medicated shampoo, soaps, skin creams, or ointments only as told by your health care provider.  Keep all follow-up visits as told by your health care provider. This is important. Contact a health care provider if:  Your symptoms do not improve with treatment.  Your symptoms get worse.  You have new symptoms. This information is not intended to replace advice given to you by your health care provider. Make sure you discuss any questions you have with your health care provider. Document Released: 07/03/2005 Document Revised: 01/21/2016 Document Reviewed: 10/21/2015 Elsevier Interactive Patient Education  [employer].

## 2018-02-13 ENCOUNTER — Encounter: Payer: Self-pay | Admitting: Physician Assistant

## 2018-02-15 ENCOUNTER — Other Ambulatory Visit: Payer: Self-pay | Admitting: Physician Assistant

## 2018-02-15 DIAGNOSIS — R300 Dysuria: Secondary | ICD-10-CM

## 2018-02-15 DIAGNOSIS — N390 Urinary tract infection, site not specified: Secondary | ICD-10-CM

## 2018-02-17 LAB — URINE CULTURE
MICRO NUMBER:: 90918732
SPECIMEN QUALITY:: ADEQUATE

## 2018-02-17 MED ORDER — CIPROFLOXACIN HCL 500 MG PO TABS: 500.0000 mg | ORAL_TABLET | Freq: Two times a day (BID) | ORAL | 0 refills | Status: DC

## 2018-02-17 NOTE — Progress Notes (Signed)
Sensitive to cipro. Should treat infection.

## 2018-03-01 ENCOUNTER — Ambulatory Visit
Admission: RE | Admit: 2018-03-01 | Discharge: 2018-03-01 | Disposition: A | Discharge status: Home / Self Care | Payer: BLUE CROSS/BLUE SHIELD | Source: Ambulatory Visit | Attending: Physician Assistant | Admitting: Physician Assistant

## 2018-03-02 NOTE — Progress Notes (Signed)
Call pt: normal mammogram. Follow up in 1 year.

## 2018-03-15 ENCOUNTER — Encounter: Payer: Self-pay | Admitting: Physician Assistant

## 2018-03-20 NOTE — Telephone Encounter (Signed)
Received fax from [employer] that Belviq requires a PA. Information has been sent to the insurance company. Awaiting determination.

## 2018-03-22 NOTE — Telephone Encounter (Signed)
Received fax from [facility] that [place] has been approved from 03/20/2018 through 09/15/2018. Form sent to scan. Pharmacy notified. -hsm.

## 2018-06-24 ENCOUNTER — Other Ambulatory Visit: Payer: Self-pay

## 2018-06-24 ENCOUNTER — Emergency Department (INDEPENDENT_AMBULATORY_CARE_PROVIDER_SITE_OTHER)
Admission: EM | Admit: 2018-06-24 | Discharge: 2018-06-24 | Disposition: A | Discharge status: Home / Self Care | Payer: BLUE CROSS/BLUE SHIELD | Source: Home / Self Care

## 2018-06-24 DIAGNOSIS — J029 Acute pharyngitis, unspecified: Secondary | ICD-10-CM | POA: Diagnosis not present

## 2018-06-24 LAB — POCT RAPID STREP A (OFFICE): Rapid Strep A Screen: NEGATIVE

## 2018-06-24 NOTE — ED Triage Notes (Signed)
Sore throat Sat.  Worse yesterday

## 2018-06-24 NOTE — Discharge Instructions (Signed)
°  You may take 567m acetaminophen every 4-6 hours or in combination with ibuprofen 400-6050mevery 6-8 hours as needed for pain, inflammation, and fever.  Be sure to well hydrated with clear liquids and get at least 8 hours of sleep at night, preferably more while sick.   Please follow up with family medicine in 1 week if needed.

## 2018-06-24 NOTE — ED Provider Notes (Signed)
[name]    CSN: [id number] Arrival date & time: 06/24/18  1449     History   Chief Complaint Chief Complaint  Patient presents with  . Sore Throat    HPI Cynthia [name] is a 47 y.o. female.   HPI  Cynthia [name] is a 47 y.o. female presenting to UC with c/o sore throat that started 2 days ago but worse yesterday.  Minimal congestion and dry cough. Denies fever, chills, n/v/d.    Past Medical History:  Diagnosis Date  . Depression   . No pertinent past medical history     Patient Active Problem List   Diagnosis Date Noted  . BMI 27.0-27.9,adult 01/02/2018  . BMI 30.0-30.9,adult 03/14/2017  . Abnormal weight gain 02/15/2017  . Appetite increase 02/15/2017  . Chronic migraine 02/23/2015  . Bad dreams 02/23/2015  . HYPERCHOLESTEROLEMIA 04/24/2006  . Major depressive disorder, recurrent episode ([place]) 04/24/2006    Past Surgical History:  Procedure Laterality Date  . BREAST BIOPSY Right 07/16/2013  . TONSILLECTOMY  1996  . WISDOM TOOTH EXTRACTION  1990    OB History    Gravida  4   Para  3   Term  3   Preterm  0   AB  1   Living  3     SAB  1   TAB  0   Ectopic  0   Multiple  0   Live Births  1            Home Medications    Prior to Admission medications   Medication Sig Start Date End Date Taking? Authorizing Provider  buPROPion (WELLBUTRIN XL) 150 MG 24 hr tablet Take 1 tablet (150 mg total) by mouth every morning. 01/02/18   Breeback, Royetta Car, PA-C  ciprofloxacin (CIPRO) 500 MG tablet Take 1 tablet (500 mg total) by mouth 2 (two) times daily. For 7 days. 02/17/18   Breeback, Royetta Car, PA-C  Lorcaserin HCl 10 MG TABS Take one tablet twice a day with meals. 01/02/18   Breeback, Royetta Car, PA-C  SUMAtriptan (IMITREX) 50 MG tablet Take 1 tablet (50 mg total) by mouth every 2 (two) hours as needed for migraine. May repeat in 2 hours if headache persists or recurs. 02/15/17   Trixie Dredge, PA-C  Vilazodone HCl  (VIIBRYD) 10 MG TABS Take 1/2 tablet daily. 06/12/17   Breeback, Royetta Car, PA-C  Vilazodone HCl (VIIBRYD) 10 MG TABS Take 0.5 tablets (5 mg total) by mouth daily. 01/02/18   Donella Stade, PA-C    Family History Family History  Problem Relation Age of Onset  . Diabetes Father   . Skin cancer Father   . Cancer Maternal Grandmother   . Breast cancer Maternal Grandmother   . Alcohol abuse Paternal Grandfather   . Parkinson's disease Mother   . Lupus Sister   . Breast cancer Sister     Social History Social History   Tobacco Use  . Smoking status: Never Smoker  . Smokeless tobacco: Never Used  Substance Use Topics  . Alcohol use: Yes  . Drug use: No     Allergies   Promethazine hcl   Review of Systems Review of Systems  Constitutional: Negative for chills and fever.  HENT: Positive for congestion and sore throat. Negative for ear pain, trouble swallowing and voice change.   Respiratory: Positive for cough. Negative for shortness of breath.   Cardiovascular: Negative for chest pain and palpitations.  Gastrointestinal:  Negative for abdominal pain, diarrhea, nausea and vomiting.  Musculoskeletal: Negative for arthralgias, back pain and myalgias.  Skin: Negative for rash.     Physical Exam Triage Vital Signs ED Triage Vitals  Enc Vitals Group     BP 06/24/18 1521 131/87     Pulse Rate 06/24/18 1521 (!) 109     Resp 06/24/18 1521 18     Temp 06/24/18 1521 98.3 F (36.8 C)     Temp Source 06/24/18 1521 Oral     SpO2 06/24/18 1521 99 %     Weight 06/24/18 1522 182 lb (82.6 kg)     Height 06/24/18 1522 5' 7"  (1.702 m)     Head Circumference --      Peak Flow --      Pain Score 06/24/18 1522 8     Pain Loc --      Pain Edu? --      Excl. in [place]? --    No data found.  Updated Vital Signs BP 131/87 (BP Location: Right Arm)   Pulse (!) 109   Temp 98.3 F (36.8 C) (Oral)   Resp 18   Ht 5' 7"  (1.702 m)   Wt 182 lb (82.6 kg)   LMP 06/13/2018   SpO2 99%   BMI  28.51 kg/m   Visual Acuity Right Eye Distance:   Left Eye Distance:   Bilateral Distance:    Right Eye Near:   Left Eye Near:    Bilateral Near:     Physical Exam  Constitutional: She is oriented to person, place, and time. She appears well-developed and well-nourished.  HENT:  Head: Normocephalic and atraumatic.  Right Ear: Tympanic membrane normal.  Left Ear: Tympanic membrane normal.  Nose: Nose normal. Right sinus exhibits no maxillary sinus tenderness and no frontal sinus tenderness. Left sinus exhibits no maxillary sinus tenderness and no frontal sinus tenderness.  Mouth/Throat: Uvula is midline and mucous membranes are normal. Posterior oropharyngeal erythema present. No oropharyngeal exudate, posterior oropharyngeal edema or tonsillar abscesses.  Eyes: EOM are normal.  Neck: Normal range of motion. Neck supple.  Cardiovascular: Normal rate and regular rhythm.  Pulmonary/Chest: Effort normal and breath sounds normal. No stridor. No respiratory distress. She has no wheezes. She has no rhonchi.  Musculoskeletal: Normal range of motion.  Lymphadenopathy:    She has no cervical adenopathy.  Neurological: She is alert and oriented to person, place, and time.  Skin: Skin is warm and dry.  Psychiatric: She has a normal mood and affect. Her behavior is normal.  Nursing note and vitals reviewed.    UC Treatments / Results  Labs (all labs ordered are listed, but only abnormal results are displayed) Labs Reviewed  STREP A DNA PROBE  POCT RAPID STREP A (OFFICE)    EKG None  Radiology No results found.  Procedures Procedures (including critical care time)  Medications Ordered in UC Medications - No data to display  Initial Impression / Assessment and Plan / UC Course  I have reviewed the triage vital signs and the nursing notes.  Pertinent labs & imaging results that were available during my care of the patient were reviewed by me and considered in my medical  decision making (see chart for details).     Hx and exam c/w viral illness encouraged symptomatic tx  Final Clinical Impressions(s) / UC Diagnoses   Final diagnoses:  Sore throat  Pharyngitis, unspecified etiology     Discharge Instructions      You  may take 573m acetaminophen every 4-6 hours or in combination with ibuprofen 400-6037mevery 6-8 hours as needed for pain, inflammation, and fever.  Be sure to well hydrated with clear liquids and get at least 8 hours of sleep at night, preferably more while sick.   Please follow up with family medicine in 1 week if needed.     ED Prescriptions    None     Controlled Substance Prescriptions [place] Controlled Substance Registry consulted? Not Applicable   PhTyrell Antonio2/10/19 1237

## 2018-06-25 ENCOUNTER — Telehealth: Payer: Self-pay

## 2018-06-25 LAB — STREP A DNA PROBE: Group A Strep Probe: NOT DETECTED

## 2018-06-25 NOTE — Telephone Encounter (Signed)
Still sore throat.  Gave lab results.  Will follow up as needed.

## 2018-07-18 ENCOUNTER — Other Ambulatory Visit: Payer: Self-pay | Admitting: Physician Assistant

## 2018-07-18 DIAGNOSIS — Z6827 Body mass index (BMI) 27.0-27.9, adult: Secondary | ICD-10-CM

## 2018-07-24 ENCOUNTER — Telehealth: Payer: Self-pay | Admitting: Physician Assistant

## 2018-07-24 NOTE — Telephone Encounter (Signed)
Approvedtoday (Belviq) [id number];IBBCWU:[id number];Review Type:Prior Auth;Coverage Start Date:06/24/2018;Coverage End Date:07/24/2019;   Left message that patient is due for a weight check follow up per PCP. Patient was asked to call the clinic back.

## 2018-10-03 ENCOUNTER — Telehealth: Payer: BC Managed Care – PPO | Admitting: Family

## 2018-10-03 DIAGNOSIS — N390 Urinary tract infection, site not specified: Secondary | ICD-10-CM | POA: Diagnosis not present

## 2018-10-03 DIAGNOSIS — A499 Bacterial infection, unspecified: Secondary | ICD-10-CM | POA: Diagnosis not present

## 2018-10-03 MED ORDER — NITROFURANTOIN MONOHYD MACRO 100 MG PO CAPS: 100.0000 mg | ORAL_CAPSULE | Freq: Two times a day (BID) | ORAL | 0 refills | Status: DC

## 2018-10-03 NOTE — Progress Notes (Signed)
We are sorry that you are not feeling well.  Here is how we plan to help!  Based on what you shared with me it looks like you most likely have a simple urinary tract infection.  A UTI (Urinary Tract Infection) is a bacterial infection of the bladder.  Most cases of urinary tract infections are simple to treat but a key part of your care is to encourage you to drink plenty of fluids and watch your symptoms carefully.  I have prescribed MacroBid 100 mg twice a day for 5 days.  Your symptoms should gradually improve. Call us if the burning in your urine worsens, you develop worsening fever, back pain or pelvic pain or if your symptoms do not resolve after completing the antibiotic.  Urinary tract infections can be prevented by drinking plenty of water to keep your body hydrated.  Also be sure when you wipe, wipe from front to back and don't hold it in!  If possible, empty your bladder every 4 hours.  Your e-visit answers were reviewed by a board certified advanced clinical practitioner to complete your personal care plan.  Depending on the condition, your plan could have included both over the counter or prescription medications.  If there is a problem please reply  once you have received a response from your provider.  Your safety is important to [place].  If you have drug allergies check your prescription carefully.    You can use MyChart to ask questions about today's visit, request a non-urgent call back, or ask for a work or school excuse for 24 hours related to this e-Visit. If it has been greater than 24 hours you will need to follow up with your provider, or enter a new e-Visit to address those concerns.   You will get an e-mail in the next two days asking about your experience.  I hope that your e-visit has been valuable and will speed your recovery. Thank you for using e-visits.

## 2018-10-16 ENCOUNTER — Encounter: Payer: Self-pay | Admitting: Physician Assistant

## 2018-10-16 DIAGNOSIS — R300 Dysuria: Secondary | ICD-10-CM

## 2018-10-18 ENCOUNTER — Other Ambulatory Visit: Payer: Self-pay | Admitting: Physician Assistant

## 2018-10-18 DIAGNOSIS — R300 Dysuria: Secondary | ICD-10-CM

## 2018-10-18 DIAGNOSIS — A499 Bacterial infection, unspecified: Secondary | ICD-10-CM

## 2018-10-18 DIAGNOSIS — N390 Urinary tract infection, site not specified: Secondary | ICD-10-CM

## 2018-10-18 LAB — URINE CULTURE
MICRO NUMBER:: 369262
SPECIMEN QUALITY:: ADEQUATE

## 2018-10-18 MED ORDER — AMOXICILLIN-POT CLAVULANATE 875-125 MG PO TABS: 1.0000 | ORAL_TABLET | Freq: Two times a day (BID) | ORAL | 0 refills | Status: DC

## 2018-10-18 NOTE — Progress Notes (Signed)
Per result notes:  "Call pt: culture grew E.coli. did have a few resistance abx. It did show sensitive to macrobid but maybe it was not long enough treatment or prehaps another infection. I am going to treat with augmentin this time. After finish abx I suggest another culture to confirm resolved infection in the next 10-14 days."  Orders entered and faxed to lab.

## 2018-10-18 NOTE — Addendum Note (Signed)
Addended by: Donella Stade on: 10/18/2018 09:33 AM   Modules accepted: Orders

## 2018-10-18 NOTE — Telephone Encounter (Signed)
Call pt: culture grew E.coli. did have a few resistance abx. It did show sensitive to macrobid but maybe it was not long enough treatment or prehaps another infection. I am going to treat with augmentin this time. After finish abx I suggest another culture to confirm resolved infection in the next 10-14 days.

## 2018-11-01 ENCOUNTER — Telehealth: Payer: Self-pay | Admitting: Neurology

## 2018-11-01 NOTE — Telephone Encounter (Signed)
Received note from [employer] that Belviq has been recalled due to a safety trial that shows an increased occurrence of certain cancers. Per Luvenia Starch- she would like [place] to call patient to let them know and offer visit to discuss medication alternatives.   Called patient. No answer. VM full. Will try again later.

## 2018-11-05 NOTE — Telephone Encounter (Signed)
Left message on machine for patient to call back.

## 2018-11-09 LAB — URINE CULTURE
MICRO NUMBER:: 421034
SPECIMEN QUALITY:: ADEQUATE

## 2018-11-10 NOTE — Progress Notes (Signed)
No bacterial infection.

## 2018-12-30 ENCOUNTER — Encounter: Payer: Self-pay | Admitting: Physician Assistant

## 2018-12-31 ENCOUNTER — Encounter: Payer: Self-pay | Admitting: Sports Medicine

## 2018-12-31 ENCOUNTER — Other Ambulatory Visit: Payer: Self-pay

## 2018-12-31 ENCOUNTER — Ambulatory Visit: Payer: BLUE CROSS/BLUE SHIELD | Admitting: Sports Medicine

## 2018-12-31 VITALS — BP 114/75 | HR 97 | Ht 67.0 in | Wt 185.0 lb

## 2018-12-31 DIAGNOSIS — N12 Tubulo-interstitial nephritis, not specified as acute or chronic: Secondary | ICD-10-CM | POA: Diagnosis not present

## 2018-12-31 DIAGNOSIS — R3129 Other microscopic hematuria: Secondary | ICD-10-CM

## 2018-12-31 DIAGNOSIS — R300 Dysuria: Secondary | ICD-10-CM

## 2018-12-31 LAB — POCT URINALYSIS DIPSTICK
Bilirubin, UA: NEGATIVE
Glucose, UA: NEGATIVE
Ketones, UA: NEGATIVE
Nitrite, UA: POSITIVE
Protein, UA: NEGATIVE
Spec Grav, UA: 1.025 (ref 1.010–1.025)
Urobilinogen, UA: 0.2 E.U./dL
pH, UA: 6 (ref 5.0–8.0)

## 2018-12-31 MED ORDER — NITROFURANTOIN MACROCRYSTAL 50 MG PO CAPS: 50.0000 mg | ORAL_CAPSULE | Freq: Every day | ORAL | 3 refills | Status: DC

## 2018-12-31 MED ORDER — CEFIXIME 400 MG PO CAPS: 400.0000 mg | ORAL_CAPSULE | Freq: Every day | ORAL | 0 refills | Status: DC

## 2018-12-31 NOTE — Telephone Encounter (Signed)
Patient scheduled.

## 2018-12-31 NOTE — Progress Notes (Signed)
Subjective:    CC: Dysuria  HPI: This is a pleasant 48 year old female, she has a long history of recurrent UTIs, more recently she has developed burning, urgency, frequency.  Feels like her typical urinary tract infections.  She has been established with a urologist, she is had a cystoscopy with biopsy that showed interstitial cystitis, has had pyelonephritis several times, she has been on daily Macrobid for preventive purposes, things went well.  More recently she has had multiple episodes of dysuria, with multiple positive urine cultures over the past year.  She wipes front to back, she voids after intercourse.  Minimal right costovertebral angle pain, no fevers, chills, night sweats.  I reviewed the past medical history, family history, social history, surgical history, and allergies today and no changes were needed.  Please see the problem list section below in epic for further details.  Past Medical History: Past Medical History:  Diagnosis Date  . Depression   . No pertinent past medical history    Past Surgical History: Past Surgical History:  Procedure Laterality Date  . BREAST BIOPSY Right 07/16/2013  . TONSILLECTOMY  1996  . WISDOM TOOTH EXTRACTION  1990   Social History: Social History   Socioeconomic History  . Marital status: Married    Spouse name: Not on file  . Number of children: Not on file  . Years of education: Not on file  . Highest education level: Not on file  Occupational History  . Not on file  Social Needs  . Financial resource strain: Not on file  . Food insecurity    Worry: Not on file    Inability: Not on file  . Transportation needs    Medical: Not on file    Non-medical: Not on file  Tobacco Use  . Smoking status: Never Smoker  . Smokeless tobacco: Never Used  Substance and Sexual Activity  . Alcohol use: Yes  . Drug use: No  . Sexual activity: Yes  Lifestyle  . Physical activity    Days per week: Not on file    Minutes per session:  Not on file  . Stress: Not on file  Relationships  . Social Herbalist on phone: Not on file    Gets together: Not on file    Attends religious service: Not on file    Active member of club or organization: Not on file    Attends meetings of clubs or organizations: Not on file    Relationship status: Not on file  Other Topics Concern  . Not on file  Social History Narrative  . Not on file   Family History: Family History  Problem Relation Age of Onset  . Diabetes Father   . Skin cancer Father   . Cancer Maternal Grandmother   . Breast cancer Maternal Grandmother   . Alcohol abuse Paternal Grandfather   . Parkinson's disease Mother   . Lupus Sister   . Breast cancer Sister    Allergies: Allergies  Allergen Reactions  . Promethazine Hcl     REACTION: convulsion   Medications: See med rec.  Review of Systems: No fevers, chills, night sweats, weight loss, chest pain, or shortness of breath.   Objective:    General: Well Developed, well nourished, and in no acute distress.  Neuro: Alert and oriented x3, extra-ocular muscles intact, sensation grossly intact.  HEENT: Normocephalic, atraumatic, pupils equal round reactive to light, neck supple, no masses, no lymphadenopathy, thyroid nonpalpable.  Skin: Warm and  dry, no rashes. Cardiac: Regular rate and rhythm, no murmurs rubs or gallops, no lower extremity edema.  Respiratory: Clear to auscultation bilaterally. Not using accessory muscles, speaking in full sentences. Abdomen: Soft, nontender, nondistended, positive pain in the right costovertebral angle, no guarding, no rigidity.  Urinalysis is positive for blood, leukocytes, nitrites.  Impression and Recommendations:    Pyelonephritis of right kidney Starting with an oral third-generation cephalosporin for 10 days. (suprax, but cefdinir is another option) Renal ultrasound. Starting Macrobid 50 mg daily for prophylaxis.     ___________________________________________ Gwen Her. Dianah Field, M.D., ABFM., CAQSM. Primary Care and Sports Medicine [facility]  Adjunct Professor of [place]

## 2018-12-31 NOTE — Assessment & Plan Note (Addendum)
Starting with an oral third-generation cephalosporin for 10 days. (suprax, but cefdinir is another option) Renal ultrasound. Starting Macrobid 50 mg daily for prophylaxis.

## 2019-01-01 ENCOUNTER — Ambulatory Visit (INDEPENDENT_AMBULATORY_CARE_PROVIDER_SITE_OTHER): Payer: BC Managed Care – PPO

## 2019-01-01 DIAGNOSIS — N12 Tubulo-interstitial nephritis, not specified as acute or chronic: Secondary | ICD-10-CM | POA: Diagnosis not present

## 2019-01-02 LAB — URINE CULTURE
MICRO NUMBER:: 575734
SPECIMEN QUALITY:: ADEQUATE

## 2019-01-27 ENCOUNTER — Other Ambulatory Visit: Payer: Self-pay | Admitting: Physician Assistant

## 2019-01-27 DIAGNOSIS — F331 Major depressive disorder, recurrent, moderate: Secondary | ICD-10-CM

## 2019-02-28 ENCOUNTER — Other Ambulatory Visit: Payer: Self-pay | Admitting: Physician Assistant

## 2019-02-28 DIAGNOSIS — F331 Major depressive disorder, recurrent, moderate: Secondary | ICD-10-CM

## 2019-03-01 ENCOUNTER — Other Ambulatory Visit: Payer: Self-pay | Admitting: Physician Assistant

## 2019-03-01 DIAGNOSIS — F331 Major depressive disorder, recurrent, moderate: Secondary | ICD-10-CM

## 2019-03-03 NOTE — Telephone Encounter (Signed)
Due for annual exam and refills. Will send one more month if can make appt within the month.

## 2019-03-03 NOTE — Telephone Encounter (Signed)
Must make appointment before any further refills given

## 2019-03-03 NOTE — Telephone Encounter (Signed)
Called patient mailbox is full and could not leave message. KG LPN

## 2019-04-07 ENCOUNTER — Other Ambulatory Visit: Payer: Self-pay | Admitting: Physician Assistant

## 2019-04-07 DIAGNOSIS — F331 Major depressive disorder, recurrent, moderate: Secondary | ICD-10-CM

## 2019-04-21 ENCOUNTER — Other Ambulatory Visit: Payer: Self-pay | Admitting: Physician Assistant

## 2019-04-21 DIAGNOSIS — F331 Major depressive disorder, recurrent, moderate: Secondary | ICD-10-CM

## 2019-04-27 ENCOUNTER — Other Ambulatory Visit: Payer: Self-pay | Admitting: Physician Assistant

## 2019-04-27 DIAGNOSIS — F331 Major depressive disorder, recurrent, moderate: Secondary | ICD-10-CM

## 2019-05-14 ENCOUNTER — Telehealth: Payer: Self-pay | Admitting: Physician Assistant

## 2019-05-14 DIAGNOSIS — F331 Major depressive disorder, recurrent, moderate: Secondary | ICD-10-CM

## 2019-05-14 MED ORDER — VIIBRYD 10 MG PO TABS: 5.0000 mg | ORAL_TABLET | Freq: Every day | ORAL | 0 refills | Status: DC

## 2019-05-14 MED ORDER — BUPROPION HCL ER (XL) 150 MG PO TB24: 150.0000 mg | ORAL_TABLET | Freq: Every day | ORAL | 0 refills | Status: DC

## 2019-05-14 NOTE — Telephone Encounter (Signed)
Ok to send another month.

## 2019-05-14 NOTE — Telephone Encounter (Signed)
Patient calling in wanting to know if she can get a med refill until her appointment on 11/2. She is completely out of VIIBRYD 10 MG TABS [[id number]] and buPROPion (WELLBUTRIN XL) 150 MG 24 hr tablet [[id number]]. Please advise.

## 2019-05-14 NOTE — Telephone Encounter (Signed)
RX sent

## 2019-05-19 ENCOUNTER — Ambulatory Visit: Payer: BC Managed Care – PPO | Admitting: Physician Assistant

## 2019-05-19 ENCOUNTER — Other Ambulatory Visit: Payer: Self-pay

## 2019-05-19 ENCOUNTER — Encounter: Payer: Self-pay | Admitting: Physician Assistant

## 2019-05-19 VITALS — BP 123/70 | HR 80 | Ht 66.0 in | Wt 186.0 lb

## 2019-05-19 DIAGNOSIS — R635 Abnormal weight gain: Secondary | ICD-10-CM | POA: Diagnosis not present

## 2019-05-19 DIAGNOSIS — F331 Major depressive disorder, recurrent, moderate: Secondary | ICD-10-CM | POA: Diagnosis not present

## 2019-05-19 DIAGNOSIS — G43709 Chronic migraine without aura, not intractable, without status migrainosus: Secondary | ICD-10-CM

## 2019-05-19 DIAGNOSIS — Z23 Encounter for immunization: Secondary | ICD-10-CM | POA: Diagnosis not present

## 2019-05-19 DIAGNOSIS — Z131 Encounter for screening for diabetes mellitus: Secondary | ICD-10-CM | POA: Diagnosis not present

## 2019-05-19 DIAGNOSIS — E7800 Pure hypercholesterolemia, unspecified: Secondary | ICD-10-CM

## 2019-05-19 DIAGNOSIS — Z0000 Encounter for general adult medical examination without abnormal findings: Secondary | ICD-10-CM

## 2019-05-19 DIAGNOSIS — F419 Anxiety disorder, unspecified: Secondary | ICD-10-CM

## 2019-05-19 DIAGNOSIS — IMO0002 Reserved for concepts with insufficient information to code with codable children: Secondary | ICD-10-CM

## 2019-05-19 MED ORDER — SUMATRIPTAN SUCCINATE 50 MG PO TABS: 50.0000 mg | ORAL_TABLET | ORAL | 3 refills | Status: DC | PRN

## 2019-05-19 MED ORDER — BUPROPION HCL ER (XL) 150 MG PO TB24: 150.0000 mg | ORAL_TABLET | Freq: Every day | ORAL | 1 refills | Status: DC

## 2019-05-19 MED ORDER — VILAZODONE HCL 20 MG PO TABS: ORAL_TABLET | ORAL | 0 refills | Status: DC

## 2019-05-19 NOTE — Progress Notes (Signed)
Subjective:    Patient ID: Cynthia [name], female    DOB: 1971/02/23, 47 y.o.   MRN: [id number]  HPI  Patient is a 48 year old female with recurrent UTIs, major depression, anxiety, chronic migraine who presents to the clinic for medication refills.  Patient states he has have been controlled with daily low-dose antibiotic therapy.  She is doing well.  For the last month she has not been doing well with her anxiety and depression.  She is noticing herself crying a lot more.  She noticed that she has not been able to go to sleep or stay asleep.  She is much more anxious and irritable.  She admits she has had some family stress recently and does not know if that is contributing or not.  She feels like she cannot shut her mind off.  She denies any suicidal thoughts or homicidal idealizations. She was previously controlled on viibryd 40m and wellbutrin 1513mXL.   Migraines seem to be controlled.  She does request her as needed Imitrex.  She is also upset with weight gain of 20lbs over last 3 months.   .. Active Ambulatory Problems    Diagnosis Date Noted  . HYPERCHOLESTEROLEMIA 04/24/2006  . [facility]  . Chronic migraine 02/23/2015  . Bad dreams 02/23/2015  . Weight gain 02/15/2017  . Appetite increase 02/15/2017  . BMI 30.0-30.9,adult 03/14/2017  . BMI 27.0-27.9,adult 01/02/2018  . Pyelonephritis of right kidney 12/31/2018  . Anxiety 05/19/2019   Resolved Ambulatory Problems    Diagnosis Date Noted  . INFLUENZA, WITH RESPIRATORY SYMPTOMS 04/09/2008   Past Medical History:  Diagnosis Date  . Depression   . No pertinent past medical history        Review of Systems See HPI.     Objective:   Physical Exam Vitals signs reviewed.  Constitutional:      Appearance: Normal appearance.  HENT:     Head: Normocephalic.  Cardiovascular:     Rate and Rhythm: Normal rate and regular rhythm.     Pulses: Normal pulses.   Pulmonary:     Effort: Pulmonary effort is normal.     Breath sounds: Normal breath sounds.  Neurological:     General: No focal deficit present.     Mental Status: She is alert and oriented to person, place, and time.  Psychiatric:     Comments: Tearful.     .. [facility] 05/19/2019 01/02/2018 05/14/2017 03/14/2017 02/15/2017  Decreased Interest 2 1 1 2 3   Down, Depressed, Hopeless 1 0 0 1 0  PHQ - 2 Score 3 1 1 3 3   Altered sleeping 3 1 1 3 3   Tired, decreased energy 3 2 1 2 3   Change in appetite 2 2 0 3 3  Feeling bad or failure about yourself  1 - 0 2 2  Trouble concentrating 1 1 2 3 3   Moving slowly or fidgety/restless 0 0 0 3 0  Suicidal thoughts 0 0 0 0 0  PHQ-9 Score 13 7 5 19 17   Difficult doing work/chores Somewhat difficult Somewhat difficult - Very difficult -   .. GAD 7 : Generalized Anxiety Score 05/19/2019 01/02/2018 05/14/2017 03/14/2017  Nervous, Anxious, on Edge 3 0 0 2  Control/stop worrying 3 1 0 1  Worry too much - different things 3 1 1 2   Trouble relaxing 2 1 1 2   Restless 0 0 0 1  Easily annoyed or irritable 3 0  0 2  Afraid - awful might happen 1 1 0 0  Total GAD 7 Score 15 4 2 10   Anxiety Difficulty Somewhat difficult Somewhat difficult - Somewhat difficult           Assessment & Plan:  Marland KitchenMarland KitchenGemma was seen today for depression.  Diagnoses and all orders for this visit:  Moderate episode of recurrent major depressive disorder (HCC) -     buPROPion (WELLBUTRIN XL) 150 MG 24 hr tablet; Take 1 tablet (150 mg total) by mouth daily. -     Vilazodone HCl 20 MG TABS; Take one tablet daily.  HYPERCHOLESTEROLEMIA -     Lipid Panel w/reflex Direct LDL  Screening for diabetes mellitus -     COMPLETE METABOLIC PANEL WITH GFR  Weight gain -     TSH  Preventative health care -     Lipid Panel w/reflex Direct LDL -     COMPLETE METABOLIC PANEL WITH GFR -     TSH -     CBC  Flu vaccine need -     Flu Vaccine QUAD 36+ mos  IM  Anxiety -     buPROPion (WELLBUTRIN XL) 150 MG 24 hr tablet; Take 1 tablet (150 mg total) by mouth daily. -     Vilazodone HCl 20 MG TABS; Take one tablet daily.  Chronic migraine -     SUMAtriptan (IMITREX) 50 MG tablet; Take 1 tablet (50 mg total) by mouth every 2 (two) hours as needed for migraine. May repeat in 2 hours if headache persists or recurs.   PHQ-9 and GAD-7 are both elevated today.  She has been relatively controlled in the past.  I suspect something externally with her family is happening causing these hopefully temporary elevations.consider counseling. Pt will think about it.   We will start with increasing Viibryd to 20 mg daily and refill Wellbutrin.  Encourage patient to use valerian root or Unisom to help with sleep.  Encouraged lavender or in good sleep routine.  Follow-up as needed or recheck in 2 months.  She has tried trazodone in the past and it made her very crazy feeling.  She does not want to try this again.  Refill for as needed Imitrex.  Patient has not had fasting labs done in a while.  Reordered today.  Follow up in 2 months.

## 2019-05-21 ENCOUNTER — Encounter: Payer: Self-pay | Admitting: Physician Assistant

## 2019-08-12 ENCOUNTER — Other Ambulatory Visit: Payer: Self-pay | Admitting: Physician Assistant

## 2019-08-12 DIAGNOSIS — F331 Major depressive disorder, recurrent, moderate: Secondary | ICD-10-CM

## 2019-08-12 DIAGNOSIS — F419 Anxiety disorder, unspecified: Secondary | ICD-10-CM

## 2019-11-09 ENCOUNTER — Other Ambulatory Visit: Payer: Self-pay | Admitting: Physician Assistant

## 2019-11-09 DIAGNOSIS — F419 Anxiety disorder, unspecified: Secondary | ICD-10-CM

## 2019-11-09 DIAGNOSIS — F331 Major depressive disorder, recurrent, moderate: Secondary | ICD-10-CM

## 2019-11-10 ENCOUNTER — Other Ambulatory Visit: Payer: Self-pay | Admitting: Physician Assistant

## 2019-11-10 DIAGNOSIS — F419 Anxiety disorder, unspecified: Secondary | ICD-10-CM

## 2019-11-10 DIAGNOSIS — F331 Major depressive disorder, recurrent, moderate: Secondary | ICD-10-CM

## 2019-11-11 ENCOUNTER — Other Ambulatory Visit: Payer: Self-pay | Admitting: Physician Assistant

## 2019-11-11 ENCOUNTER — Other Ambulatory Visit: Payer: Self-pay | Admitting: Nurse Practitioner

## 2019-11-11 DIAGNOSIS — F419 Anxiety disorder, unspecified: Secondary | ICD-10-CM

## 2019-11-11 DIAGNOSIS — F331 Major depressive disorder, recurrent, moderate: Secondary | ICD-10-CM

## 2019-11-12 ENCOUNTER — Other Ambulatory Visit: Payer: Self-pay

## 2019-11-12 DIAGNOSIS — N63 Unspecified lump in unspecified breast: Secondary | ICD-10-CM | POA: Insufficient documentation

## 2019-11-12 DIAGNOSIS — K625 Hemorrhage of anus and rectum: Secondary | ICD-10-CM | POA: Insufficient documentation

## 2019-11-12 NOTE — Telephone Encounter (Signed)
Advised the patient of  Rx change to Wellbutrin.  Patient will schedule her 6 month follow-up for Mid-May.

## 2019-12-23 ENCOUNTER — Other Ambulatory Visit: Payer: Self-pay | Admitting: Physician Assistant

## 2019-12-23 DIAGNOSIS — F331 Major depressive disorder, recurrent, moderate: Secondary | ICD-10-CM

## 2019-12-23 DIAGNOSIS — F419 Anxiety disorder, unspecified: Secondary | ICD-10-CM

## 2019-12-23 MED ORDER — BUPROPION HCL ER (XL) 150 MG PO TB24: 150.0000 mg | ORAL_TABLET | Freq: Every day | ORAL | 0 refills | Status: DC

## 2019-12-23 NOTE — Telephone Encounter (Signed)
Patient needs a refill on brupropion, is out. Appt scheduled for June 23.

## 2019-12-23 NOTE — Telephone Encounter (Signed)
Sent a 30 day supply.

## 2020-01-05 ENCOUNTER — Other Ambulatory Visit: Payer: Self-pay | Admitting: Physician Assistant

## 2020-01-05 DIAGNOSIS — IMO0002 Reserved for concepts with insufficient information to code with codable children: Secondary | ICD-10-CM

## 2020-01-07 ENCOUNTER — Other Ambulatory Visit: Payer: Self-pay

## 2020-01-07 ENCOUNTER — Encounter: Payer: Self-pay | Admitting: Physician Assistant

## 2020-01-07 ENCOUNTER — Ambulatory Visit: Payer: BC Managed Care – PPO | Admitting: Physician Assistant

## 2020-01-07 VITALS — BP 106/69 | HR 70 | Ht 66.0 in | Wt 180.0 lb

## 2020-01-07 DIAGNOSIS — G479 Sleep disorder, unspecified: Secondary | ICD-10-CM

## 2020-01-07 DIAGNOSIS — Z0000 Encounter for general adult medical examination without abnormal findings: Secondary | ICD-10-CM | POA: Diagnosis not present

## 2020-01-07 DIAGNOSIS — Z1231 Encounter for screening mammogram for malignant neoplasm of breast: Secondary | ICD-10-CM

## 2020-01-07 DIAGNOSIS — Z13220 Encounter for screening for lipoid disorders: Secondary | ICD-10-CM

## 2020-01-07 DIAGNOSIS — Z1159 Encounter for screening for other viral diseases: Secondary | ICD-10-CM

## 2020-01-07 DIAGNOSIS — G43709 Chronic migraine without aura, not intractable, without status migrainosus: Secondary | ICD-10-CM

## 2020-01-07 DIAGNOSIS — N12 Tubulo-interstitial nephritis, not specified as acute or chronic: Secondary | ICD-10-CM

## 2020-01-07 DIAGNOSIS — Z1211 Encounter for screening for malignant neoplasm of colon: Secondary | ICD-10-CM

## 2020-01-07 DIAGNOSIS — F331 Major depressive disorder, recurrent, moderate: Secondary | ICD-10-CM | POA: Diagnosis not present

## 2020-01-07 DIAGNOSIS — F419 Anxiety disorder, unspecified: Secondary | ICD-10-CM | POA: Diagnosis not present

## 2020-01-07 DIAGNOSIS — IMO0002 Reserved for concepts with insufficient information to code with codable children: Secondary | ICD-10-CM

## 2020-01-07 DIAGNOSIS — Z131 Encounter for screening for diabetes mellitus: Secondary | ICD-10-CM

## 2020-01-07 LAB — COMPLETE METABOLIC PANEL WITH GFR
AG Ratio: 2 (calc) (ref 1.0–2.5)
ALT: 16 U/L (ref 6–29)
AST: 12 U/L (ref 10–35)
Albumin: 4.3 g/dL (ref 3.6–5.1)
Alkaline phosphatase (APISO): 80 U/L (ref 31–125)
BUN: 13 mg/dL (ref 7–25)
CO2: 27 mmol/L (ref 20–32)
Calcium: 9.6 mg/dL (ref 8.6–10.2)
Chloride: 105 mmol/L (ref 98–110)
Creat: 0.96 mg/dL (ref 0.50–1.10)
GFR, Est African American: 82 mL/min/{1.73_m2} (ref >=60)
GFR, Est Non African American: 70 mL/min/{1.73_m2} (ref >=60)
Globulin: 2.2 g/dL (calc) (ref 1.9–3.7)
Glucose, Bld: 92 mg/dL (ref 65–99)
Potassium: 4.5 mmol/L (ref 3.5–5.3)
Sodium: 141 mmol/L (ref 135–146)
Total Bilirubin: 0.4 mg/dL (ref 0.2–1.2)
Total Protein: 6.5 g/dL (ref 6.1–8.1)

## 2020-01-07 LAB — LIPID PANEL W/REFLEX DIRECT LDL
Cholesterol: 233 mg/dL — ABNORMAL HIGH (ref <200)
HDL: 55 mg/dL (ref >=50)
LDL Cholesterol (Calc): 159 mg/dL (calc) — ABNORMAL HIGH
Non-HDL Cholesterol (Calc): 178 mg/dL (calc) — ABNORMAL HIGH (ref <130)
Total CHOL/HDL Ratio: 4.2 (calc) (ref <5.0)
Triglycerides: 83 mg/dL (ref <150)

## 2020-01-07 LAB — TSH: TSH: 3.43 mIU/L

## 2020-01-07 LAB — CBC
HCT: 43.1 % (ref 35.0–45.0)
Hemoglobin: 14.1 g/dL (ref 11.7–15.5)
MCH: 28.5 pg (ref 27.0–33.0)
MCHC: 32.7 g/dL (ref 32.0–36.0)
MCV: 87.2 fL (ref 80.0–100.0)
MPV: 10.8 fL (ref 7.5–12.5)
Platelets: 357 10*3/uL (ref 140–400)
RBC: 4.94 10*6/uL (ref 3.80–5.10)
RDW: 12.8 % (ref 11.0–15.0)
WBC: 5.5 10*3/uL (ref 3.8–10.8)

## 2020-01-07 MED ORDER — SUMATRIPTAN SUCCINATE 50 MG PO TABS: 50.0000 mg | ORAL_TABLET | Freq: Once | ORAL | 5 refills | Status: DC

## 2020-01-07 MED ORDER — BUPROPION HCL ER (XL) 150 MG PO TB24: 150.0000 mg | ORAL_TABLET | Freq: Every day | ORAL | 3 refills | Status: DC

## 2020-01-07 MED ORDER — VIIBRYD 20 MG PO TABS: 1.0000 | ORAL_TABLET | Freq: Every day | ORAL | 3 refills | Status: DC

## 2020-01-07 MED ORDER — NITROFURANTOIN MACROCRYSTAL 50 MG PO CAPS: 50.0000 mg | ORAL_CAPSULE | Freq: Every day | ORAL | 3 refills | Status: DC

## 2020-01-07 NOTE — Patient Instructions (Addendum)
Health Maintenance, Female Adopting a healthy lifestyle and getting preventive care are important in promoting health and wellness. Ask your health care provider about:  The right schedule for you to have regular tests and exams.  Things you can do on your own to prevent diseases and keep yourself healthy. What should I know about diet, weight, and exercise? Eat a healthy diet   Eat a diet that includes plenty of vegetables, fruits, low-fat dairy products, and lean protein.  Do not eat a lot of foods that are high in solid fats, added sugars, or sodium. Maintain a healthy weight Body mass index (BMI) is used to identify weight problems. It estimates body fat based on height and weight. Your health care provider can help determine your BMI and help you achieve or maintain a healthy weight. Get regular exercise Get regular exercise. This is one of the most important things you can do for your health. Most adults should:  Exercise for at least 150 minutes each week. The exercise should increase your heart rate and make you sweat (moderate-intensity exercise).  Do strengthening exercises at least twice a week. This is in addition to the moderate-intensity exercise.  Spend less time sitting. Even light physical activity can be beneficial. Watch cholesterol and blood lipids Have your blood tested for lipids and cholesterol at 48 years of age, then have this test every 5 years. Have your cholesterol levels checked more often if:  Your lipid or cholesterol levels are high.  You are older than 48 years of age.  You are at high risk for heart disease. What should I know about cancer screening? Depending on your health history and family history, you may need to have cancer screening at various ages. This may include screening for:  Breast cancer.  Cervical cancer.  Colorectal cancer.  Skin cancer.  Lung cancer. What should I know about heart disease, diabetes, and high blood  pressure? Blood pressure and heart disease  High blood pressure causes heart disease and increases the risk of stroke. This is more likely to develop in people who have high blood pressure readings, are of African descent, or are overweight.  Have your blood pressure checked: ? Every 3-5 years if you are 18-39 years of age. ? Every year if you are 40 years old or older. Diabetes Have regular diabetes screenings. This checks your fasting blood sugar level. Have the screening done:  Once every three years after age 40 if you are at a normal weight and have a low risk for diabetes.  More often and at a younger age if you are overweight or have a high risk for diabetes. What should I know about preventing infection? Hepatitis B If you have a higher risk for hepatitis B, you should be screened for this virus. Talk with your health care provider to find out if you are at risk for hepatitis B infection. Hepatitis C Testing is recommended for:  Everyone born from 1945 through 1965.  Anyone with known risk factors for hepatitis C. Sexually transmitted infections (STIs)  Get screened for STIs, including gonorrhea and chlamydia, if: ? You are sexually active and are younger than 48 years of age. ? You are older than 48 years of age and your health care provider tells you that you are at risk for this type of infection. ? Your sexual activity has changed since you were last screened, and you are at increased risk for chlamydia or gonorrhea. Ask your health care provider if   you are at risk.  Ask your health care provider about whether you are at high risk for HIV. Your health care provider may recommend a prescription medicine to help prevent HIV infection. If you choose to take medicine to prevent HIV, you should first get tested for HIV. You should then be tested every 3 months for as long as you are taking the medicine. Pregnancy  If you are about to stop having your period (premenopausal) and  you may become pregnant, seek counseling before you get pregnant.  Take 400 to 800 micrograms (mcg) of folic acid every day if you become pregnant.  Ask for birth control (contraception) if you want to prevent pregnancy. Osteoporosis and menopause Osteoporosis is a disease in which the bones lose minerals and strength with aging. This can result in bone fractures. If you are 7 years old or older, or if you are at risk for osteoporosis and fractures, ask your health care provider if you should:  Be screened for bone loss.  Take a calcium or vitamin D supplement to lower your risk of fractures.  Be given hormone replacement therapy (HRT) to treat symptoms of menopause. Follow these instructions at home: Lifestyle  Do not use any products that contain nicotine or tobacco, such as cigarettes, e-cigarettes, and chewing tobacco. If you need help quitting, ask your health care provider.  Do not use street drugs.  Do not share needles.  Ask your health care provider for help if you need support or information about quitting drugs. Alcohol use  Do not drink alcohol if: ? Your health care provider tells you not to drink. ? You are pregnant, may be pregnant, or are planning to become pregnant.  If you drink alcohol: ? Limit how much you use to 0-1 drink a day. ? Limit intake if you are breastfeeding.  Be aware of how much alcohol is in your drink. In the [place] one drink equals one 12 oz bottle of beer (355 mL), one 5 oz glass of wine (148 mL), or one 1 oz glass of hard liquor (44 mL). General instructions  Schedule regular health, dental, and eye exams.  Stay current with your vaccines.  Tell your health care provider if: ? You often feel depressed. ? You have ever been abused or do not feel safe at home. Summary  Adopting a healthy lifestyle and getting preventive care are important in promoting health and wellness.  Follow your health care provider's instructions about healthy  diet, exercising, and getting tested or screened for diseases.  Follow your health care provider's instructions on monitoring your cholesterol and blood pressure. This information is not intended to replace advice given to you by your health care provider. Make sure you discuss any questions you have with your health care provider. Document Revised: 06/26/2018 Document Reviewed: 06/26/2018 Elsevier Patient Education  2020 [employer] oral tablets What is this medicine? Hunt (lem-bor-EX-ant) is used to treat insomnia. This medicine helps you to fall asleep and sleep through the night. This medicine may be used for other purposes; ask your health care provider or pharmacist if you have questions. COMMON BRAND NAME(S): DAYVIGO What should I tell my health care provider before I take this medicine? They need to know if you have any of these conditions:  depression  drink alcohol  drug abuse or addiction  feel sleepy or have fallen asleep suddenly during the day  history of a sudden onset of muscle weakness (cataplexy)  liver disease  lung or breathing disease, like asthma or emphysema  sleep apnea  suicidal thoughts, plans, or attempt; a previous suicide attempt by you or a family member  an unusual or allergic reaction to lemborexant, other medicines, foods, dyes, or preservatives  pregnant or trying to get pregnant  breast-feeding How should I use this medicine? Take this medicine by mouth with a glass of water right before going to bed. Do not take it unless you are able to stay in bed a full night (at least 7 hours) before you must be active again. Follow the directions on the prescription label. For best results, take this medicine on an empty stomach. Do not take it more often than directed. Do not stop taking except on your doctor's advice. A special MedGuide will be given to you by the pharmacist with each prescription and refill. Be sure to read  this information carefully each time. Talk to your pediatrician about the use of this medicine in children. Special care may be needed. Overdosage: If you think you have taken too much of this medicine contact a poison control center or emergency room at once. NOTE: This medicine is only for you. Do not share this medicine with others. What if I miss a dose? This does not apply. This medicine should only be taken immediately before going to sleep. Do not take double or extra doses. What may interact with this medicine?  alcohol  antihistamines for allergy, cough, or cold  bosentan  certain antibiotics like clarithromycin, rifampin  certain antivirals for HIV like efavirenz, etravirine  certain medicines for sleep or anxiety  certain medicines for depression like amitriptyline, bupropion, fluoxetine, sertraline  certain medicines for fungal infections like itraconazole, and fluconazole  certain medicines for seizures like carbamazepine  chlorzoxazone  general anesthetics like halothane, isoflurane, methoxyflurane, propofol  medicines that relax muscles for surgery  modafinil  narcotic medicines for pain  phenothiazines like chlorpromazine, mesoridazine, prochlorperazine, thioridazine  ranitidine  St. john's wort  verapamil This list may not describe all possible interactions. Give your health care provider a list of all the medicines, herbs, non-prescription drugs, or dietary supplements you use. Also tell them if you smoke, drink alcohol, or use illegal drugs. Some items may interact with your medicine. What should I watch for while using this medicine? Visit your health care professional for regular checks on your progress. Tell your health care professional if your symptoms do not start to get better or if they get worse. Avoid caffeine-containing drinks in the evening hours. After taking this medicine, you may get up out of bed and do an activity that you do not know  you are doing. The next morning, you may have no memory of this. Activities include driving a car ("sleep-driving"), making and eating food, talking on the phone, sexual activity, and sleep-walking. Serious injuries have occurred. Stop the medicine and call your doctor right away if you find out you have done any of these activities. Do not take this medicine if you have used alcohol that evening. Do not take it if you have taken another medicine for sleep. Do not take this medicine unless you are able to stay in bed for a full night (7 to 8 hours) before you must be active again. You may have a decrease in mental alertness the day after use, even if you feel you are fully awake. Tell your health care professional if you will need to perform activities requiring full alertness, such as driving, the next  day. Do not stand or sit up quickly after taking this medicine, especially if you are an older patient. This reduces the risk of dizzy or fainting spells. If you or your family notice any changes in your behavior, such as new or worsening depression, thoughts of harming yourself, anxiety, other unusual or disturbing thoughts, or memory loss, call your health care professional right away. After you stop taking this medicine, you may have trouble falling asleep. This is called rebound insomnia. This problem usually goes away on its own after 1 or 2 nights. What side effects may I notice from receiving this medicine? Side effects that you should report to your doctor or health care professional as soon as possible:  allergic reactions like skin rash, itching or hives, swelling of the face, lips, or tongue  hallucinations  periods of leg weakness lasting from seconds to a few minutes  suicidal thoughts, mood changes  unable to move or speak for several minutes while going to sleep or waking up  unusual activities while not fully awake like driving, eating, making phone calls, or sexual activity Side  effects that usually do not require medical attention (report these to your doctor or health care professional if they continue or are bothersome):  daytime drowsiness  headache  nightmares or abnormal dreams  tiredness This list may not describe all possible side effects. Call your doctor for medical advice about side effects. You may report side effects to FDA at 1-800-FDA-1088. Where should I keep my medicine? Keep out of the reach of children. This medicine can be abused. Keep your medicine in a safe place to protect it from theft. Do not share this medicine with anyone. Selling or giving away this medicine is dangerous and against the law. Store at room temperature between 20 and 25 degrees C (68 and 77 degrees F). Throw away any unused medicine after the expiration date. NOTE: This sheet is a summary. It may not cover all possible information. If you have questions about this medicine, talk to your doctor, pharmacist, or health care provider.  2020 Elsevier/Gold Standard (2018-09-10 13:27:05)

## 2020-01-07 NOTE — Progress Notes (Signed)
Subjective:    Patient ID: Cynthia [name], female    DOB: February 03, 1971, 49 y.o.   MRN: [id number]  HPI  Patient is a 49 year old female with chronic migraine, anxiety, depression who presents to the clinic for medication refill and overall health maintenance update.  Patient is working really hard to lose weight and be healthier.  She has lost 6 pounds since November.  She continues to have some problems staying asleep but not going to sleep.  Her mood is improved significantly.  She denies any suicidal thoughts or homicidal idealizations.  She does walk she has more energy.  Overall she is happier and healthier.  Patient does see GYN for Pap and woman care.  .. Active Ambulatory Problems    Diagnosis Date Noted  . HYPERCHOLESTEROLEMIA 04/24/2006  . Major depressive disorder, recurrent episode ([place]) 04/24/2006  . Chronic migraine 02/23/2015  . Bad dreams 02/23/2015  . Weight gain 02/15/2017  . Appetite increase 02/15/2017  . Pyelonephritis of right kidney 12/31/2018  . Anxiety 05/19/2019  . Painless rectal bleeding 11/12/2019  . Breast lump 11/12/2019  . Trouble in sleeping 01/07/2020   Resolved Ambulatory Problems    Diagnosis Date Noted  . INFLUENZA, WITH RESPIRATORY SYMPTOMS 04/09/2008  . BMI 30.0-30.9,adult 03/14/2017  . BMI 27.0-27.9,adult 01/02/2018   Past Medical History:  Diagnosis Date  . Depression   . No pertinent past medical history      Review of Systems  All other systems reviewed and are negative.      Objective:   Physical Exam Vitals reviewed.  Constitutional:      Appearance: Normal appearance.  HENT:     Head: Normocephalic.  Cardiovascular:     Rate and Rhythm: Normal rate and regular rhythm.     Pulses: Normal pulses.  Pulmonary:     Effort: Pulmonary effort is normal.     Breath sounds: Normal breath sounds.  Neurological:     General: No focal deficit present.     Mental Status: She is alert and oriented to person, place, and time.   Psychiatric:        Mood and Affect: Mood normal.       .. Depression screen [facility] 2/9 01/07/2020 05/19/2019 01/02/2018 05/14/2017 03/14/2017  Decreased Interest 2 2 1 1 2   Down, Depressed, Hopeless 0 1 0 0 1  PHQ - 2 Score 2 3 1 1 3   Altered sleeping 3 3 1 1 3   Tired, decreased energy 1 3 2 1 2   Change in appetite 2 2 2  0 3  Feeling bad or failure about yourself  0 1 - 0 2  Trouble concentrating 2 1 1 2 3   Moving slowly or fidgety/restless 0 0 0 0 3  Suicidal thoughts 0 0 0 0 0  PHQ-9 Score 10 13 7 5 19   Difficult doing work/chores Somewhat difficult Somewhat difficult Somewhat difficult - Very difficult   .Marland Kitchen GAD 7 : Generalized Anxiety Score 01/07/2020 05/19/2019 01/02/2018 05/14/2017  Nervous, Anxious, on Edge 1 3 0 0  Control/stop worrying 2 3 1  0  Worry too much - different things 1 3 1 1   Trouble relaxing 1 2 1 1   Restless 0 0 0 0  Easily annoyed or irritable 1 3 0 0  Afraid - awful might happen 2 1 1  0  Total GAD 7 Score 8 15 4 2   Anxiety Difficulty Somewhat difficult Somewhat difficult Somewhat difficult -        Assessment & Plan:  Marland KitchenMarland Kitchen  Cynthia [name] was seen today for follow-up.  Diagnoses and all orders for this visit:  Routine physical examination  Moderate episode of recurrent major depressive disorder (HCC) -     Vilazodone HCl (VIIBRYD) 20 MG TABS; Take 1 tablet (20 mg total) by mouth daily. -     buPROPion (WELLBUTRIN XL) 150 MG 24 hr tablet; Take 1 tablet (150 mg total) by mouth daily. -     CBC  Anxiety -     Vilazodone HCl (VIIBRYD) 20 MG TABS; Take 1 tablet (20 mg total) by mouth daily. -     buPROPion (WELLBUTRIN XL) 150 MG 24 hr tablet; Take 1 tablet (150 mg total) by mouth daily. -     TSH -     CBC  Chronic migraine -     SUMAtriptan (IMITREX) 50 MG tablet; Take 1 tablet (50 mg total) by mouth once for 1 dose. Can repeat in 2 hrs PRN -     CBC  Pyelonephritis of right kidney -     nitrofurantoin (MACRODANTIN) 50 MG capsule; Take 1 capsule (50 mg  total) by mouth daily.  Encounter for screening mammogram for malignant neoplasm of breast -     MM 3D SCREEN BREAST BILATERAL  Screening for diabetes mellitus -     COMPLETE METABOLIC PANEL WITH GFR  Screening for lipid disorders -     Lipid Panel w/reflex Direct LDL  Encounter for hepatitis C screening test for low risk patient -     Hepatitis C Antibody  Colon cancer screening -     Ambulatory referral to Gastroenterology  Trouble in sleeping   .Marland Kitchen Discussed 150 minutes of exercise a week.  Encouraged vitamin D 1000 units and Calcium 1340m or 4 servings of dairy a day.  Pap is not up-to-date.  She needs to make appointment with GYN. Mammogram ordered today for downstairs. She is 89and meets guidelines for colonoscopy that were recently up dated.  Labs ordered.  Medications refilled.  Vaccines UTD.  Declined covid vaccine.   Discussed sleep. Consider davigo in the future. HO given. Problems staying asleep not going.   PHQ/GAD improved refills sent.

## 2020-01-09 NOTE — Progress Notes (Signed)
Cynthia [name],   Your kidney, liver, glucose look great.  No anemia.  Thyroid is upper limits of normal. If you were having hypothyroid symptoms at some point we could consider supplementation to get you to optimal 1-2 range. You could also use diet and nutrition to improve thyroid function.  Cholesterol is up from 2 years ago. LDL at 159. Optimal is under 100. HDL(good cholesterol) looks great. Your 10 year CV risk is still low at .8 percent. Continue to work on diet and exercise and will will continue to monitor.

## 2020-01-14 ENCOUNTER — Ambulatory Visit: Payer: BC Managed Care – PPO

## 2020-02-05 ENCOUNTER — Encounter: Payer: Self-pay | Admitting: Physician Assistant

## 2020-02-05 ENCOUNTER — Other Ambulatory Visit: Payer: Self-pay | Admitting: Physician Assistant

## 2020-02-05 DIAGNOSIS — F331 Major depressive disorder, recurrent, moderate: Secondary | ICD-10-CM

## 2020-02-05 DIAGNOSIS — F419 Anxiety disorder, unspecified: Secondary | ICD-10-CM

## 2020-02-06 NOTE — Telephone Encounter (Signed)
Sent for one year in June 2021. Will call pharmacy to verify.

## 2020-02-06 NOTE — Telephone Encounter (Signed)
Approved today CaseId:[id number];Status:Approved;Review Type:Prior Auth;Coverage Start Date:01/07/2020;Coverage End Date:02/05/2021; Pharmacy aware.

## 2020-02-06 NOTE — Telephone Encounter (Signed)
Viibryd - needs PA

## 2020-04-14 ENCOUNTER — Other Ambulatory Visit: Payer: Self-pay

## 2020-04-14 ENCOUNTER — Ambulatory Visit (INDEPENDENT_AMBULATORY_CARE_PROVIDER_SITE_OTHER): Payer: BC Managed Care – PPO

## 2020-04-14 DIAGNOSIS — Z1231 Encounter for screening mammogram for malignant neoplasm of breast: Secondary | ICD-10-CM | POA: Diagnosis not present

## 2020-04-19 NOTE — Progress Notes (Signed)
Normal mammogram. Follow up in 1 year.

## 2020-05-28 ENCOUNTER — Encounter: Payer: Self-pay | Admitting: Physician Assistant

## 2020-05-31 NOTE — Telephone Encounter (Signed)
Can we call drug rep and see if they can talk to the pharmacy and at least make sure there is no way around this?

## 2020-05-31 NOTE — Telephone Encounter (Signed)
Amber - can you contact the drug rep?

## 2020-06-02 NOTE — Telephone Encounter (Signed)
Spoke with drug rep yesterday and he actually took new coupon cards to the pharmacy but even after running the new one her cost is still $158.  Unfortunately, it looks like you're going to have to switch her medication.

## 2020-06-04 MED ORDER — FLUOXETINE HCL 20 MG PO TABS: 20.0000 mg | ORAL_TABLET | Freq: Every day | ORAL | 1 refills | Status: DC

## 2020-06-26 ENCOUNTER — Other Ambulatory Visit: Payer: Self-pay | Admitting: Physician Assistant

## 2020-07-30 DIAGNOSIS — R7303 Prediabetes: Secondary | ICD-10-CM | POA: Insufficient documentation

## 2020-07-30 DIAGNOSIS — E559 Vitamin D deficiency, unspecified: Secondary | ICD-10-CM | POA: Insufficient documentation

## 2020-10-03 ENCOUNTER — Other Ambulatory Visit: Payer: Self-pay | Admitting: Physician Assistant

## 2020-11-04 ENCOUNTER — Other Ambulatory Visit: Payer: Self-pay | Admitting: Physician Assistant

## 2020-11-18 ENCOUNTER — Other Ambulatory Visit: Payer: Self-pay | Admitting: Physician Assistant

## 2020-11-29 ENCOUNTER — Other Ambulatory Visit: Payer: Self-pay | Admitting: Physician Assistant

## 2020-12-21 ENCOUNTER — Other Ambulatory Visit: Payer: Self-pay | Admitting: Physician Assistant

## 2020-12-22 ENCOUNTER — Telehealth: Payer: Self-pay | Admitting: Physician Assistant

## 2020-12-22 MED ORDER — FLUOXETINE HCL 20 MG PO TABS: 20.0000 mg | ORAL_TABLET | Freq: Every day | ORAL | 0 refills | Status: DC

## 2020-12-22 NOTE — Telephone Encounter (Signed)
Pt called for a refill on her Prozac. She wants a few pills until she comes in for her appointment; scheduled  for June 13th. Thank you.

## 2020-12-22 NOTE — Telephone Encounter (Signed)
Sent in one week of medication.

## 2020-12-27 ENCOUNTER — Ambulatory Visit: Payer: BC Managed Care – PPO | Admitting: Physician Assistant

## 2020-12-27 ENCOUNTER — Other Ambulatory Visit: Payer: Self-pay

## 2020-12-27 ENCOUNTER — Encounter: Payer: Self-pay | Admitting: Physician Assistant

## 2020-12-27 VITALS — BP 105/63 | HR 81 | Ht 66.0 in | Wt 182.0 lb

## 2020-12-27 DIAGNOSIS — F331 Major depressive disorder, recurrent, moderate: Secondary | ICD-10-CM

## 2020-12-27 DIAGNOSIS — N390 Urinary tract infection, site not specified: Secondary | ICD-10-CM

## 2020-12-27 DIAGNOSIS — E7800 Pure hypercholesterolemia, unspecified: Secondary | ICD-10-CM

## 2020-12-27 DIAGNOSIS — Z0000 Encounter for general adult medical examination without abnormal findings: Secondary | ICD-10-CM

## 2020-12-27 DIAGNOSIS — Z1329 Encounter for screening for other suspected endocrine disorder: Secondary | ICD-10-CM

## 2020-12-27 DIAGNOSIS — F419 Anxiety disorder, unspecified: Secondary | ICD-10-CM

## 2020-12-27 DIAGNOSIS — Z13220 Encounter for screening for lipoid disorders: Secondary | ICD-10-CM

## 2020-12-27 DIAGNOSIS — Z1211 Encounter for screening for malignant neoplasm of colon: Secondary | ICD-10-CM

## 2020-12-27 DIAGNOSIS — Z1159 Encounter for screening for other viral diseases: Secondary | ICD-10-CM

## 2020-12-27 DIAGNOSIS — Z131 Encounter for screening for diabetes mellitus: Secondary | ICD-10-CM

## 2020-12-27 DIAGNOSIS — G43909 Migraine, unspecified, not intractable, without status migrainosus: Secondary | ICD-10-CM | POA: Insufficient documentation

## 2020-12-27 MED ORDER — SUMATRIPTAN SUCCINATE 50 MG PO TABS: 50.0000 mg | ORAL_TABLET | Freq: Once | ORAL | 5 refills | Status: DC

## 2020-12-27 MED ORDER — FLUOXETINE HCL 20 MG PO TABS: 20.0000 mg | ORAL_TABLET | Freq: Every day | ORAL | 3 refills | Status: DC

## 2020-12-27 MED ORDER — BUPROPION HCL ER (XL) 150 MG PO TB24: 150.0000 mg | ORAL_TABLET | Freq: Every day | ORAL | 3 refills | Status: DC

## 2020-12-27 MED ORDER — NITROFURANTOIN MACROCRYSTAL 50 MG PO CAPS: 50.0000 mg | ORAL_CAPSULE | Freq: Every day | ORAL | 3 refills | Status: DC

## 2020-12-27 NOTE — Progress Notes (Signed)
Subjective:     Cynthia [name] is a 50 y.o. female and is here for a comprehensive physical exam. The patient reports no problems. She does need medications refilled. She has been out of prozac for 1 week and she can tell with her irritability and anxiousness.   Social History   Socioeconomic History   Marital status: Married    Spouse name: Not on file   Number of children: Not on file   Years of education: Not on file   Highest education level: Not on file  Occupational History   Not on file  Tobacco Use   Smoking status: Never   Smokeless tobacco: Never  Vaping Use   Vaping Use: Never used  Substance and Sexual Activity   Alcohol use: Yes   Drug use: No   Sexual activity: Yes  Other Topics Concern   Not on file  Social History Narrative   Not on file   Social Determinants of Health   Financial Resource Strain: Not on file  Food Insecurity: Not on file  Transportation Needs: Not on file  Physical Activity: Not on file  Stress: Not on file  Social Connections: Not on file  Intimate Partner Violence: Not on file   Health Maintenance  Topic Date Due   Hepatitis C Screening  Never done   COLONOSCOPY (Pts 45-36yr Insurance coverage will need to be confirmed)  Never done   PAP SMEAR-Modifier  12/27/2020 (Originally 10/21/2018)   COVID-19 Vaccine (3 - Booster for [employer]) 01/12/2021 (Originally 07/31/2020)   INFLUENZA VACCINE  02/14/2021   MAMMOGRAM  04/14/2021   Zoster Vaccines- Shingrix (1 of 2) 02/14/2022   TETANUS/TDAP  06/17/2023   HIV Screening  Completed   Pneumococcal Vaccine [id number] old  Aged Out   HPV VACCINES  Aged Out    The following portions of the patient's history were reviewed and updated as appropriate: allergies, current medications, past family history, past medical history, past social history, past surgical history, and problem list.  Review of Systems A comprehensive review of systems was negative.   Objective:    BP 105/63    Pulse 81   Ht 5' 6"  (1.676 m)   Wt 182 lb (82.6 kg)   SpO2 100%   BMI 29.38 kg/m  General appearance: alert, cooperative, and appears stated age Head: Normocephalic, without obvious abnormality, atraumatic Eyes: conjunctivae/corneas clear. PERRL, EOM's intact. Fundi benign. Ears: normal TM's and external ear canals both ears Nose: Nares normal. Septum midline. Mucosa normal. No drainage or sinus tenderness. Throat: lips, mucosa, and tongue normal; teeth and gums normal Neck: no adenopathy, no carotid bruit, no JVD, supple, symmetrical, trachea midline, and thyroid not enlarged, symmetric, no tenderness/mass/nodules Back: symmetric, no curvature. ROM normal. No CVA tenderness. Lungs: clear to auscultation bilaterally Heart: regular rate and rhythm, S1, S2 normal, no murmur, click, rub or gallop Abdomen: soft, non-tender; bowel sounds normal; no masses,  no organomegaly Extremities: extremities normal, atraumatic, no cyanosis or edema Pulses: 2+ and symmetric Skin: Skin color, texture, turgor normal. No rashes or lesions Lymph nodes: Cervical, supraclavicular, and axillary nodes normal. Neurologic: Grossly normal   .. Depression screen [facility]/9 01/07/2020 05/19/2019 01/02/2018 05/14/2017 03/14/2017  Decreased Interest 2 2 1 1 2   Down, Depressed, Hopeless 0 1 0 0 1  PHQ - 2 Score 2 3 1 1 3   Altered sleeping 3 3 1 1 3   Tired, decreased energy 1 3 2 1 2   Change in appetite 2 2  2 0 3  Feeling bad or failure about yourself  0 1 - 0 2  Trouble concentrating 2 1 1 2 3   Moving slowly or fidgety/restless 0 0 0 0 3  Suicidal thoughts 0 0 0 0 0  PHQ-9 Score 10 13 7 5 19   Difficult doing work/chores Somewhat difficult Somewhat difficult Somewhat difficult - Very difficult   .Marland Kitchen GAD 7 : Generalized Anxiety Score 01/07/2020 05/19/2019 01/02/2018 05/14/2017  Nervous, Anxious, on Edge 1 3 0 0  Control/stop worrying 2 3 1  0  Worry too much - different things 1 3 1 1   Trouble relaxing 1 2 1 1   Restless  0 0 0 0  Easily annoyed or irritable 1 3 0 0  Afraid - awful might happen 2 1 1  0  Total GAD 7 Score 8 15 4 2   Anxiety Difficulty Somewhat difficult Somewhat difficult Somewhat difficult -     Assessment:    Healthy female exam.      Plan:  Marland KitchenMarland KitchenHiba was seen today for follow-up.  Diagnoses and all orders for this visit:  Routine physical examination -     CBC with Differential/Platelet -     COMPLETE METABOLIC PANEL WITH GFR -     Hemoglobin A1c -     Lipid Panel w/reflex Direct LDL -     TSH  Screening for diabetes mellitus -     COMPLETE METABOLIC PANEL WITH GFR -     Hemoglobin A1c  Screening for lipid disorders -     Lipid Panel w/reflex Direct LDL  Pure hypercholesterolemia -     Lipid Panel w/reflex Direct LDL  Thyroid disorder screen -     TSH  Moderate episode of recurrent major depressive disorder (HCC) -     FLUoxetine (PROZAC) 20 MG tablet; Take 1 tablet (20 mg total) by mouth daily. -     buPROPion (WELLBUTRIN XL) 150 MG 24 hr tablet; Take 1 tablet (150 mg total) by mouth daily.  Anxiety -     FLUoxetine (PROZAC) 20 MG tablet; Take 1 tablet (20 mg total) by mouth daily. -     buPROPion (WELLBUTRIN XL) 150 MG 24 hr tablet; Take 1 tablet (150 mg total) by mouth daily.  Migraine without status migrainosus, not intractable, unspecified migraine type -     SUMAtriptan (IMITREX) 50 MG tablet; Take 1 tablet (50 mg total) by mouth once for 1 dose. Can repeat in 2 hrs PRN  Colon cancer screening -     Ambulatory referral to Gastroenterology  Encounter for hepatitis C screening test for low risk patient -     Hepatitis C Antibody  Recurrent UTI -     nitrofurantoin (MACRODANTIN) 50 MG capsule; Take 1 capsule (50 mg total) by mouth daily.   Discussed 150 minutes of exercise a week.  Encouraged vitamin D 1000 units and Calcium 1362m or 4 servings of dairy a day.  Fasting labs ordered.  PHQ/GAD not to goal today but per patient she has been out of her  medication. She would answer differently on medication.  Colonoscopy ordered.  Mammogram due in September.  Hep C ordered.  Covid vaccine with no boosters.    Refilled medications for 1 year.       See After Visit Summary for Counseling Recommendations

## 2021-03-07 ENCOUNTER — Encounter: Payer: Self-pay | Admitting: Physician Assistant

## 2021-03-07 ENCOUNTER — Ambulatory Visit: Payer: BC Managed Care – PPO | Admitting: Physician Assistant

## 2021-03-07 ENCOUNTER — Other Ambulatory Visit: Payer: Self-pay

## 2021-03-07 ENCOUNTER — Ambulatory Visit (INDEPENDENT_AMBULATORY_CARE_PROVIDER_SITE_OTHER): Payer: BC Managed Care – PPO

## 2021-03-07 VITALS — BP 105/76 | HR 98 | Wt 181.0 lb

## 2021-03-07 DIAGNOSIS — R0781 Pleurodynia: Secondary | ICD-10-CM | POA: Diagnosis not present

## 2021-03-07 DIAGNOSIS — M25521 Pain in right elbow: Secondary | ICD-10-CM | POA: Diagnosis not present

## 2021-03-07 MED ORDER — TRAMADOL HCL 50 MG PO TABS
50.0000 mg | ORAL_TABLET | Freq: Four times a day (QID) | ORAL | 0 refills | Status: AC | PRN
Start: 2021-03-07 — End: 2021-03-12

## 2021-03-07 MED ORDER — IBUPROFEN 800 MG PO TABS: 800.0000 mg | ORAL_TABLET | Freq: Three times a day (TID) | ORAL | 2 refills | Status: DC | PRN

## 2021-03-07 NOTE — Progress Notes (Signed)
Subjective:    Patient ID: Cynthia [name], female    DOB: 1971/06/18, 50 y.o.   MRN: [id number]  HPI Pt is a 50 yo female with no significant orthopedic history who presents to the clinic with right anterior rib pain and right lateral elbow pain.   On the 5th of August she was getting out of water pushing up on her hands and arms when she heard a pop in her right ribs and immediately felt pain. That pain has continued. Worse with breathing and touching the area. Wearing a bra hurts. She has not had imaged. She is taking tylenol and ibuprofen for this and helping some. No cough.   3 days later she fell and landed on her right lateral elbow. She had pain and swelling initially but the swelling did get better. Her lateral elbow aches with occasional shooting pain down arm depending on what she is doing. She admits weak and painful grip. It is "just hard to use this hand and arm".   .. Active Ambulatory Problems    Diagnosis Date Noted   HYPERCHOLESTEROLEMIA 04/24/2006   Major depressive disorder, recurrent episode ([place]) 04/24/2006   Chronic migraine 02/23/2015   Bad dreams 02/23/2015   Weight gain 02/15/2017   Appetite increase 02/15/2017   Pyelonephritis of right kidney 12/31/2018   Anxiety 05/19/2019   Painless rectal bleeding 11/12/2019   Breast lump 11/12/2019   Trouble in sleeping 01/07/2020   Migraine without status migrainosus, not intractable 12/27/2020   Resolved Ambulatory Problems    Diagnosis Date Noted   INFLUENZA, WITH RESPIRATORY SYMPTOMS 04/09/2008   BMI 30.0-30.9,adult 03/14/2017   BMI 27.0-27.9,adult 01/02/2018   Past Medical History:  Diagnosis Date   Depression    No pertinent past medical history       Review of Systems See HPI.     Objective:   Physical Exam Vitals reviewed.  Constitutional:      Appearance: Normal appearance.  Cardiovascular:     Rate and Rhythm: Normal rate.     Pulses: Normal pulses.     Heart sounds: Normal heart sounds.   Pulmonary:     Effort: Pulmonary effort is normal.     Breath sounds: Normal breath sounds.  Musculoskeletal:     Comments: Right anterior rib tenderness to palpation over 6, 7, 8. No bruising.   Right elbow: Pain to palpation over lateral epicondyle and just below over common extensor tendon insertion. Mild swelling noted just below epicondyle.  Pain with any extension of right wrist.  Weak resisted supination of right wrist.  Weak resisted extension of right wrist.   Neurological:     General: No focal deficit present.     Mental Status: She is alert and oriented to person, place, and time.  Psychiatric:        Mood and Affect: Mood normal.          Assessment & Plan:  Marland KitchenMarland KitchenKaede was seen today for fall.  Diagnoses and all orders for this visit:  Right elbow pain -     DG Elbow Complete Right; Future -     ibuprofen (ADVIL) 800 MG tablet; Take 1 tablet (800 mg total) by mouth every 8 (eight) hours as needed. -     traMADol (ULTRAM) 50 MG tablet; Take 1 tablet (50 mg total) by mouth every 6 (six) hours as needed for up to 5 days.  Rib pain on right side -     DG Ribs Unilateral W/Chest Right;  Future -     ibuprofen (ADVIL) 800 MG tablet; Take 1 tablet (800 mg total) by mouth every 8 (eight) hours as needed. -     traMADol (ULTRAM) 50 MG tablet; Take 1 tablet (50 mg total) by mouth every 6 (six) hours as needed for up to 5 days.   Rib pain concerning for fracture. If confirmed fracture consider bone density screening. Discussed may take weeks to heal.   Right elbow pain concerning for tear of common extensor tendon.  Will get xray and MRI proceeding due to neuro deficits.  7/10 pain.  No hx of seizures or addition.  ..PDMP reviewed during this encounter. Tramadol small quantity given for break through pain.

## 2021-03-08 ENCOUNTER — Other Ambulatory Visit: Payer: Self-pay | Admitting: Physician Assistant

## 2021-03-08 ENCOUNTER — Encounter: Payer: Self-pay | Admitting: Physician Assistant

## 2021-03-08 DIAGNOSIS — M25521 Pain in right elbow: Secondary | ICD-10-CM

## 2021-03-08 DIAGNOSIS — G8929 Other chronic pain: Secondary | ICD-10-CM

## 2021-03-08 DIAGNOSIS — D169 Benign neoplasm of bone and articular cartilage, unspecified: Secondary | ICD-10-CM

## 2021-03-08 DIAGNOSIS — S59901A Unspecified injury of right elbow, initial encounter: Secondary | ICD-10-CM

## 2021-03-08 DIAGNOSIS — R29898 Other symptoms and signs involving the musculoskeletal system: Secondary | ICD-10-CM

## 2021-03-08 DIAGNOSIS — M25511 Pain in right shoulder: Secondary | ICD-10-CM

## 2021-03-08 NOTE — Progress Notes (Signed)
Xray negative for any fracture. Will move forward with MRI.

## 2021-03-08 NOTE — Progress Notes (Signed)
I was waiting for your elbow xray as well but has not resulted. You do NOT have rib fracture. Likely the pop was just cartilage attached to the rib. Gentle massage/ice/NSAIDS and time should help pain.   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 Are you having any right shoulder pain? Decreased shoulder ROM?

## 2021-03-11 DIAGNOSIS — G8929 Other chronic pain: Secondary | ICD-10-CM | POA: Insufficient documentation

## 2021-03-11 DIAGNOSIS — D169 Benign neoplasm of bone and articular cartilage, unspecified: Secondary | ICD-10-CM | POA: Insufficient documentation

## 2021-03-11 DIAGNOSIS — M25511 Pain in right shoulder: Secondary | ICD-10-CM | POA: Insufficient documentation

## 2021-03-13 ENCOUNTER — Other Ambulatory Visit: Payer: Self-pay

## 2021-03-13 ENCOUNTER — Ambulatory Visit (INDEPENDENT_AMBULATORY_CARE_PROVIDER_SITE_OTHER): Payer: BC Managed Care – PPO

## 2021-03-13 DIAGNOSIS — S59901A Unspecified injury of right elbow, initial encounter: Secondary | ICD-10-CM

## 2021-03-13 DIAGNOSIS — M25521 Pain in right elbow: Secondary | ICD-10-CM | POA: Diagnosis not present

## 2021-03-13 DIAGNOSIS — R29898 Other symptoms and signs involving the musculoskeletal system: Secondary | ICD-10-CM

## 2021-03-15 NOTE — Progress Notes (Signed)
Is this something you can manage? She does have a interstitial tear and on exam hand grip/neurological weakening. I was thinking ortho referral for possible repair but wanted to run it by you first.

## 2021-03-15 NOTE — Progress Notes (Signed)
You do have inflammation of the common extensor tendon and tear is noted. Dr. Darene Lamer our sports medicine provider can manage this. Make appt with him.

## 2021-03-22 ENCOUNTER — Other Ambulatory Visit: Payer: Self-pay

## 2021-03-22 ENCOUNTER — Ambulatory Visit: Payer: BC Managed Care – PPO | Admitting: Sports Medicine

## 2021-03-22 DIAGNOSIS — M7711 Lateral epicondylitis, right elbow: Secondary | ICD-10-CM | POA: Diagnosis not present

## 2021-03-22 MED ORDER — NAPROXEN 500 MG PO TABS: 500.0000 mg | ORAL_TABLET | Freq: Two times a day (BID) | ORAL | 3 refills | Status: DC

## 2021-03-22 NOTE — Progress Notes (Signed)
    Procedures performed today:    None.  Independent interpretation of notes and tests performed by another provider:   None.  Brief History, Exam, Impression, and Recommendations:    Lateral epicondylitis, right elbow This is a very pleasant 50 year old female, she was seen by Iran Planas, PA-C, ultimately diagnosed with an interstitial tear of the right common extensor tendon at the origin consistent with tennis elbow. She had a fall but reports pain prior to the fall. On exam she has findings classic and consistent with tennis elbow including difficulty fully extending the elbow, pain with extension of the wrist and middle finger, and tenderness at the extensor tendon origin. We discussed the anatomy, physiology, treatment plan will consist of naproxen, physical therapy, return to see me in 4 to 6 weeks, we will consider PRP percutaneous tenotomy if not better.    ___________________________________________ Gwen Her. Dianah Field, M.D., ABFM., CAQSM. Primary Care and [place] Instructor of [place] [employer] [facility] of Medicine

## 2021-03-22 NOTE — Assessment & Plan Note (Signed)
This is a very pleasant 50 year old female, she was seen by Iran Planas, PA-C, ultimately diagnosed with an interstitial tear of the right common extensor tendon at the origin consistent with tennis elbow. She had a fall but reports pain prior to the fall. On exam she has findings classic and consistent with tennis elbow including difficulty fully extending the elbow, pain with extension of the wrist and middle finger, and tenderness at the extensor tendon origin. We discussed the anatomy, physiology, treatment plan will consist of naproxen, physical therapy, return to see me in 4 to 6 weeks, we will consider PRP percutaneous tenotomy if not better.

## 2021-03-23 ENCOUNTER — Ambulatory Visit: Payer: BC Managed Care – PPO | Admitting: Rehabilitative and Restorative Service Providers"

## 2021-03-23 ENCOUNTER — Encounter: Payer: Self-pay | Admitting: Rehabilitative and Restorative Service Providers"

## 2021-03-23 DIAGNOSIS — R29898 Other symptoms and signs involving the musculoskeletal system: Secondary | ICD-10-CM | POA: Diagnosis not present

## 2021-03-23 DIAGNOSIS — M7712 Lateral epicondylitis, left elbow: Secondary | ICD-10-CM

## 2021-03-23 DIAGNOSIS — R293 Abnormal posture: Secondary | ICD-10-CM

## 2021-03-23 DIAGNOSIS — M7711 Lateral epicondylitis, right elbow: Secondary | ICD-10-CM

## 2021-03-23 NOTE — Therapy (Signed)
[facility] [place], [place], [address] Phone: [phone]   Fax:  [phone]  Physical Therapy Evaluation  Patient Details  Name: Cynthia [name] MRN: [id number] Date of Birth: 01-Aug-1970 Referring Provider (PT): Dr Dianah Field   Encounter Date: 03/23/2021   PT End of Session - 03/23/21 1701     Visit Number 1    Number of Visits 12    Date for PT Re-Evaluation 05/04/21    PT Start Time 6962    PT Stop Time 1518    PT Time Calculation (min) 45 min    Activity Tolerance Patient tolerated treatment well             Past Medical History:  Diagnosis Date   Depression    No pertinent past medical history     Past Surgical History:  Procedure Laterality Date   BREAST BIOPSY Right 07/16/2013   TONSILLECTOMY  1996   WISDOM TOOTH EXTRACTION  1990    There were no vitals filed for this visit.    Subjective Assessment - 03/23/21 1440     Subjective Patient reports that she has had Rt elbow pain since 01/28/21. Elbow was irritated from fishing and then she fell landing on the Rt arm. Pain has persisted since that time with increase in the past 3-4 weeks. Now continues to have pain with functional activities and any lifting.    Pertinent History anxiety; knee pain    Diagnostic tests Mild tendinosis of the common extensor tendon origin with an  interstitial tear.    Patient Stated Goals get rid og elbow pain and use Rt arm again    Currently in Pain? Yes    Pain Score 6     Pain Location Elbow    Pain Orientation Right    Pain Descriptors / Indicators Stabbing;Aching;Dull   stabbing with movement or any lifting   Pain Radiating Towards proximal and distal elbow area    Pain Onset More than a month ago    Pain Frequency Intermittent    Aggravating Factors  movement; lifting; ADL;s difficlutly sleeping    Pain Relieving Factors avoid movement                [facility] PT Assessment - 03/23/21  0001       Assessment   Medical Diagnosis Rt lateral epicondylitis; torn extensor tendons Rt elbow    Referring Provider (PT) Dr Dianah Field    Onset Date/Surgical Date 01/28/21    Hand Dominance Right    Next MD Visit 05/03/21    Prior Therapy here for knee      Precautions   Precautions None      Restrictions   Weight Bearing Restrictions No      Balance Screen   Has the patient fallen in the past 6 months Yes    How many times? 1    Has the patient had a decrease in activity level because of a fear of falling?  No    Is the patient reluctant to leave their home because of a fear of falling?  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    Leisure household chores; fishing      Observation/Other Assessments  Focus on Therapeutic Outcomes (FOTO)  38      Sensation   Additional Comments WFL's per pt report      Posture/Postural Control   Posture Comments head forward; shoulders rounded      AROM   Overall AROM Comments tightness Rt shoudler IR    Right Shoulder Internal Rotation --   hand to hip/posterior buttock   Right Elbow Flexion 122    Right Elbow Extension -19    Left Elbow Flexion 155    Left Elbow Extension 5    Right Forearm Supination 69 Degrees    Left Forearm Supination 81 Degrees    Right/Left Wrist --   tight end range Rt wrist ROM WFL's ~ equal bilat     Strength   Overall Strength Comments MMT deferred due to pain - moves bilat UE's against gravity in all planes      Palpation   Palpation comment significant tightness and tenderness to palpation through the Rt extensor forearm into lateral epicondyle area and distal triceps area                        Objective measurements completed on examination: See above  findings.       [facility] Adult PT Treatment/Exercise - 03/23/21 0001       Neuro Re-ed    Neuro Re-ed Details  postural correction shoudlers down and back      Elbow Exercises   Elbow Extension AROM;Right;5 reps;Seated    Forearm Supination AROM;Right;5 reps;Seated      Wrist Exercises   Wrist Flexion Limitations wrist flexor/wrist extensor stretch 10 sec hold x 2-3 reps      Moist Heat Therapy   Number Minutes Moist Heat 10 Minutes    Moist Heat Location Elbow      Electrical Stimulation   Electrical Stimulation Location Rt extensor forearm to distal arm    Electrical Stimulation Action TENS    Electrical Stimulation Parameters to tolerance    Electrical Stimulation Goals Pain;Tone      Manual Therapy   Manual therapy comments instrution in retrograde massage Rt extensor forearm                  Upper Extremity Functional Index Score:  /80   PT Education - 03/23/21 1700     Person(s) Educated Patient    Methods Explanation;Demonstration;Tactile cues;Verbal cues;Handout    Comprehension Verbalized understanding;Returned demonstration;Verbal cues required;Tactile cues required;Need further instruction                 PT Long Term Goals - 03/23/21 1710       PT LONG TERM GOAL #1   Title Increase AROM Rt UE to WFL's in shoulder, elbow, forearm    Time 6    Period Weeks    Status New    Target Date 05/04/21      PT LONG TERM GOAL #2   Title Return to all normal functional activities using Rt UE    Time 6    Period Weeks    Status New    Target Date 05/04/21      PT LONG TERM GOAL #3   Title Independent in HEP    Time 6    Period Weeks    Status New    Target Date 05/04/21      PT LONG TERM GOAL #4   Title Improve functional limitation score to 58    Time 6  Period Weeks    Status New    Target Date 05/04/21                    Plan - 03/23/21 1702     Clinical Impression Statement Patient presents with ~ 3 month history  of Rt elbow pain following use of Rt UE when fishing and then a subsequent fall landing on her Rt arm. Patient holds Rt UE in guarded posture with elbow/shoulder abducted position. She has limited AROM/mobility; muscular tightness to palpation through the Rt extensor forearm into the distal triceps area. She has pain with all functional activities. Patient will benefit from PT to address problems identified.    Stability/Clinical Decision Making Stable/Uncomplicated    Clinical Decision Making Low    Rehab Potential Good    PT Frequency 2x / week    PT Duration 6 weeks    PT Treatment/Interventions ADLs/Self Care Home Management;Aquatic Therapy;Cryotherapy;Electrical Stimulation;Iontophoresis 50m/ml Dexamethasone;Moist Heat;Ultrasound;Therapeutic activities;Therapeutic exercise;Neuromuscular re-education;Patient/family education;Manual techniques;Passive range of motion;Dry needling;Taping;Vasopneumatic Device    PT Next Visit Plan review HEP; trial of manual work vs DN; eccentric strengthening; postural correction; modalities as indicated    PT Home Exercise Plan [email or web address]   Consulted and Agree with Plan of Care Patient             Patient will benefit from skilled therapeutic intervention in order to improve the following deficits and impairments:  Decreased range of motion, Increased fascial restricitons, Impaired UE functional use, Pain, Decreased activity tolerance, Impaired flexibility, Postural dysfunction, Decreased strength, Decreased mobility  Visit Diagnosis: Lateral epicondylitis of left elbow  Other symptoms and signs involving the musculoskeletal system  Abnormal posture     Problem List Patient Active Problem List   Diagnosis Date Noted   Lateral epicondylitis, right elbow 03/22/2021   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   Chronic right shoulder pain 03/11/2021   Migraine without status migrainosus, not intractable 12/27/2020   Trouble in sleeping 01/07/2020   Painless  rectal bleeding 11/12/2019   Breast lump 11/12/2019   Anxiety 05/19/2019   Pyelonephritis of right kidney 12/31/2018   Weight gain 02/15/2017   Appetite increase 02/15/2017   Chronic migraine 02/23/2015   Bad dreams 02/23/2015   HYPERCHOLESTEROLEMIA 04/24/2006   Major depressive disorder, recurrent episode ([place] 04/24/2006    Darrien Belter PNilda Simmer PT MPH  03/23/2021, 5:13 PM  [facility] [address]Phone: [phone]  Fax:  [phone] Name: Cynthia BEVILACQUAMRN: [id number] of Birth: 801-05-1971

## 2021-03-23 NOTE — Patient Instructions (Signed)
Access Code: [email or web address] URL: [email or web address] Date: 03/23/2021 Prepared by: [facility]  Exercises Seated Wrist Flexion Stretch (Mirrored) - 2-3 x daily - 7 x weekly - 3 reps - 15-30 seconds hold Wrist Circumduction AROM - 2-3 x daily - 7 x weekly - 1 sets - 10 reps Seated Scapular Retraction - 2-3 x daily - 7 x weekly - 1 sets - 5 reps - 5 seconds hold Seated Forearm Pronation and Supination AROM - 2-3 x daily - 7 x weekly - 1 sets - 10 reps Seated Triceps Extension - 2 x daily - 7 x weekly - 5 reps - 5-10 seconds hold  Patient Education Ice Massage TENS Unit Trigger Point Dry Needling

## 2021-03-28 ENCOUNTER — Other Ambulatory Visit: Payer: Self-pay

## 2021-03-28 ENCOUNTER — Ambulatory Visit: Payer: BC Managed Care – PPO | Admitting: Physical Therapy

## 2021-03-28 DIAGNOSIS — R29898 Other symptoms and signs involving the musculoskeletal system: Secondary | ICD-10-CM | POA: Diagnosis not present

## 2021-03-28 DIAGNOSIS — R293 Abnormal posture: Secondary | ICD-10-CM | POA: Diagnosis not present

## 2021-03-28 DIAGNOSIS — M7712 Lateral epicondylitis, left elbow: Secondary | ICD-10-CM | POA: Diagnosis not present

## 2021-03-28 DIAGNOSIS — M7711 Lateral epicondylitis, right elbow: Secondary | ICD-10-CM

## 2021-03-28 NOTE — Patient Instructions (Signed)

## 2021-03-28 NOTE — Therapy (Signed)
[place], [place], [address] Phone: [phone]   Fax:  [phone]  Physical Therapy Treatment  Patient Details  Name: Cynthia [name] MRN: [id number] Date of Birth: 1971-06-26 Referring Provider (PT): Dr Dianah Field   Encounter Date: 03/28/2021   PT End of Session - 03/28/21 0850     Visit Number 2    Number of Visits 12    Date for PT Re-Evaluation 05/04/21    PT Start Time 0850    PT Stop Time 0934   ice last 10 min   PT Time Calculation (min) 44 min    Activity Tolerance Patient tolerated treatment well    Behavior During Therapy [facility] for tasks assessed/performed             Past Medical History:  Diagnosis Date   Depression    No pertinent past medical history     Past Surgical History:  Procedure Laterality Date   BREAST BIOPSY Right 07/16/2013   TONSILLECTOMY  1996   WISDOM TOOTH EXTRACTION  1990    There were no vitals filed for this visit.   Subjective Assessment - 03/28/21 0852     Subjective Pt reports that her pain in Rt elbow comes and goes, not constant. She has been doing the exercises and massaging Rt arm.    Pertinent History anxiety; knee pain    Diagnostic tests Mild tendinosis of the common extensor tendon origin with an  interstitial tear.    Patient Stated Goals get rid og elbow pain and use Rt arm again    Currently in Pain? Yes    Pain Score 5     Pain Location Elbow    Pain Orientation Right;Lateral    Pain Descriptors / Indicators Sore    Pain Onset More than a month ago    Aggravating Factors  lifting items    Pain Relieving Factors finding the right position                [facility] PT Assessment - 03/28/21 0001       Assessment   Medical Diagnosis Rt lateral epicondylitis; torn extensor tendons Rt elbow    Referring Provider (PT) Dr Dianah Field    Onset Date/Surgical Date 01/28/21    Hand Dominance Right    Next MD Visit 05/03/21     Prior Therapy here for knee              [facility] Adult PT Treatment/Exercise - 03/28/21 0001       Elbow Exercises   Elbow Flexion AAROM;Right;5 reps    Elbow Extension AROM;Right;5 reps   20 sec holds   Forearm Supination AROM;Right;5 reps;Seated   10 sec hold.   Bar Weights/Barbell (Forearm Supination) 1 lb    Forearm Pronation AROM;Right;5 reps   10 sec hold   Bar Weights/Barbell (Forearm Pronation) 1 lb      Shoulder Exercises: Supine   Other Supine Exercises scap retraction, 5 sec x 5.      Shoulder Exercises: Stretch   Other Shoulder Stretches snow angels x 5 reps      Wrist Exercises   Wrist Flexion Limitations wrist flexor/wrist extensor stretch 10 sec hold x 2 reps    Wrist Radial Deviation Strengthening;Right;10 reps    Bar Weights/Barbell (Radial Deviation) 1 lb    Other wrist exercises wrist circumduction x 10    Other wrist exercises midlevel doorway stretch x 15 sec x 3 reps.  pec  stretch with straight arm on wall (abdct) x 15 sec x 2.  Open book x 5 reps with wrist ext, bilat      Modalities   Modalities Cryotherapy      Cryotherapy   Number Minutes Cryotherapy 10 Minutes    Cryotherapy Location Forearm    Type of Cryotherapy Ice pack      Manual Therapy   Manual Therapy Soft tissue mobilization;Taping    Soft tissue mobilization IASTM / STM to Rt wrist flexors and extensors, biceps brachii, distal lateral humerus to decrease fascial restrictions and improve mobility.    [place] I strip of reg Rock tape applied to Rt lateral distal humerus to prox ulna; perpendicular strip on lateral supracondylar ridge                          PT Long Term Goals - 03/23/21 1710       PT LONG TERM GOAL #1   Title Increase AROM Rt UE to WFL's in shoulder, elbow, forearm    Time 6    Period Weeks    Status New    Target Date 05/04/21      PT LONG TERM GOAL #2   Title Return to all normal functional  activities using Rt UE    Time 6    Period Weeks    Status New    Target Date 05/04/21      PT LONG TERM GOAL #3   Title Independent in HEP    Time 6    Period Weeks    Status New    Target Date 05/04/21      PT LONG TERM GOAL #4   Title Improve functional limitation score to 58    Time 6    Period Weeks    Status New    Target Date 05/04/21                   Plan - 03/28/21 1206     Clinical Impression Statement Continued pain and tenderness in Rt wrist extensors and elbow flexors.  Addressed entire fascial chain with pec stretch and neural glides during open book.  Pt initially reported some relief of pain with IASMT, however when performing gentle gripping/ wrist circumduction of Rt wrist, pain increased.  Trial of Rock tape applied to Rt lateral elbow.  Goals are ongoing.    Stability/Clinical Decision Making Stable/Uncomplicated    Rehab Potential Good    PT Frequency 2x / week    PT Duration 6 weeks    PT Treatment/Interventions ADLs/Self Care Home Management;Aquatic Therapy;Cryotherapy;Electrical Stimulation;Iontophoresis 86m/ml Dexamethasone;Moist Heat;Ultrasound;Therapeutic activities;Therapeutic exercise;Neuromuscular re-education;Patient/family education;Manual techniques;Passive range of motion;Dry needling;Taping;Vasopneumatic Device    PT Next Visit Plan assess response to tape. manual work vs DN; eccentric strengthening; postural correction; modalities as indicated    PT Home Exercise Plan [email or web address]   Consulted and Agree with Plan of Care Patient             Patient will benefit from skilled therapeutic intervention in order to improve the following deficits and impairments:  Decreased range of motion, Increased fascial restricitons, Impaired UE functional use, Pain, Decreased activity tolerance, Impaired flexibility, Postural dysfunction, Decreased strength, Decreased mobility  Visit Diagnosis: Lateral epicondylitis of left elbow  Other symptoms  and signs involving the musculoskeletal system  Abnormal posture     Problem List Patient Active Problem  List   Diagnosis Date Noted   Lateral epicondylitis, right elbow 03/22/2021   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   Chronic right shoulder pain 03/11/2021   Migraine without status migrainosus, not intractable 12/27/2020   Trouble in sleeping 01/07/2020   Painless rectal bleeding 11/12/2019   Breast lump 11/12/2019   Anxiety 05/19/2019   Pyelonephritis of right kidney 12/31/2018   Weight gain 02/15/2017   Appetite increase 02/15/2017   Chronic migraine 02/23/2015   Bad dreams 02/23/2015   HYPERCHOLESTEROLEMIA 04/24/2006   Major depressive disorder, recurrent episode ([place]) 04/24/2006   Kerin Perna, PTA 03/28/21 12:09 PM  [facility] [address] [place], [place], [address] Phone: [phone]   Fax:  [phone]  Name: Cynthia [name] MRN: [id number] Date of Birth: 11-Apr-1971

## 2021-03-31 ENCOUNTER — Other Ambulatory Visit: Payer: Self-pay

## 2021-03-31 ENCOUNTER — Encounter: Payer: Self-pay | Admitting: Rehabilitative and Restorative Service Providers"

## 2021-03-31 ENCOUNTER — Ambulatory Visit: Payer: BC Managed Care – PPO | Admitting: Rehabilitative and Restorative Service Providers"

## 2021-03-31 DIAGNOSIS — R29898 Other symptoms and signs involving the musculoskeletal system: Secondary | ICD-10-CM

## 2021-03-31 DIAGNOSIS — R293 Abnormal posture: Secondary | ICD-10-CM

## 2021-03-31 DIAGNOSIS — M7711 Lateral epicondylitis, right elbow: Secondary | ICD-10-CM

## 2021-03-31 NOTE — Therapy (Signed)
[place], [place], [address] Phone: [phone]   Fax:  [phone]  Physical Therapy Treatment  Patient Details  Name: Cynthia [name] MRN: [id number] Date of Birth: July 27, 1970 Referring Provider (PT): Dr Dianah Field   Encounter Date: 03/31/2021   PT End of Session - 03/31/21 0928     Visit Number 3    Number of Visits 12    Date for PT Re-Evaluation 05/04/21    PT Start Time 0928    PT Stop Time 1016    PT Time Calculation (min) 48 min    Activity Tolerance Patient tolerated treatment well             Past Medical History:  Diagnosis Date   Depression    No pertinent past medical history     Past Surgical History:  Procedure Laterality Date   BREAST BIOPSY Right 07/16/2013   TONSILLECTOMY  1996   WISDOM TOOTH EXTRACTION  1990    There were no vitals filed for this visit.   Subjective Assessment - 03/31/21 0929     Subjective Patient reports that Rt elbow area is stiff but not really painful. Likes the tape. Working on her exercises.    Currently in Pain? No/denies    Pain Score 0-No pain   4/10 with elbow flexion no pain at rest   Pain Location Elbow                               [facility] Adult PT Treatment/Exercise - 03/31/21 0001       Elbow Exercises   Elbow Flexion AAROM;Right;5 reps    Elbow Extension AROM;Right;5 reps   20 sec holds   Forearm Supination AROM;Right;5 reps;Seated   10 sec hold.   Bar Weights/Barbell (Forearm Supination) 1 lb    Forearm Pronation AROM;Right;5 reps   10 sec hold   Bar Weights/Barbell (Forearm Pronation) 1 lb      Shoulder Exercises: Supine   Other Supine Exercises scap retraction, 10 sec x 10      Wrist Exercises   Other wrist exercises 3 way doorway stretch 30 sec x 2 reps each position      Moist Heat Therapy   Number Minutes Moist Heat 10 Minutes    Moist Heat Location Elbow   forearm     Manual  Therapy   Manual Therapy Soft tissue mobilization;Taping    Manual therapy comments skilled palpation to assess response to DN and manual work    Soft tissue mobilization deep tissue work through the [employer] I strip of reg Rock tape applied to Rt lateral distal humerus to prox ulna; perpendicular strip on lateral supracondylar ridge              Trigger Point Dry Needling - 03/31/21 0001     Consent Given? Yes    Education Handout Provided Yes    Muscles Treated Wrist/Hand Extensor carpi radialis longus/brevis    Dry Needling Comments Rt    Extensor carpi radialis longus/brevis Response Palpable increased muscle length;Twitch response elicited                   PT Education - 03/31/21 0948     Education Details DN HEP    Person(s) Educated Patient    Methods Explanation;Handout  Comprehension Verbalized understanding                 PT Long Term Goals - 03/23/21 1710       PT LONG TERM GOAL #1   Title Increase AROM Rt UE to WFL's in shoulder, elbow, forearm    Time 6    Period Weeks    Status New    Target Date 05/04/21      PT LONG TERM GOAL #2   Title Return to all normal functional activities using Rt UE    Time 6    Period Weeks    Status New    Target Date 05/04/21      PT LONG TERM GOAL #3   Title Independent in HEP    Time 6    Period Weeks    Status New    Target Date 05/04/21      PT LONG TERM GOAL #4   Title Improve functional limitation score to 58    Time 6    Period Weeks    Status New    Target Date 05/04/21                   Plan - 03/31/21 0949     Clinical Impression Statement No pain at rest. Continued pain with movement. Good response to tape. Trail of DN and manual work Rt extensor forearm with palpable decrease in muscular tightness trough the Rt extensor forearm musculature. Added eccentric wrist extension and 3 positions for pec  stretch in doorway. Worked on eccentric wrist extension lowering hand from supported surface.    Rehab Potential Good    PT Frequency 2x / week    PT Duration 6 weeks    PT Treatment/Interventions ADLs/Self Care Home Management;Aquatic Therapy;Cryotherapy;Electrical Stimulation;Iontophoresis 41m/ml Dexamethasone;Moist Heat;Ultrasound;Therapeutic activities;Therapeutic exercise;Neuromuscular re-education;Patient/family education;Manual techniques;Passive range of motion;Dry needling;Taping;Vasopneumatic Device    PT Next Visit Plan assess response to manual work and DN; continue with eccentric strengthening; postural correction; modalities as indicated    PT Home Exercise Plan [email or web address]   Consulted and Agree with Plan of Care Patient             Patient will benefit from skilled therapeutic intervention in order to improve the following deficits and impairments:     Visit Diagnosis: Lateral epicondylitis of right elbow  Other symptoms and signs involving the musculoskeletal system  Abnormal posture     Problem List Patient Active Problem List   Diagnosis Date Noted   Lateral epicondylitis, right elbow 03/22/2021   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   Chronic right shoulder pain 03/11/2021   Migraine without status migrainosus, not intractable 12/27/2020   Trouble in sleeping 01/07/2020   Painless rectal bleeding 11/12/2019   Breast lump 11/12/2019   Anxiety 05/19/2019   Pyelonephritis of right kidney 12/31/2018   Weight gain 02/15/2017   Appetite increase 02/15/2017   Chronic migraine 02/23/2015   Bad dreams 02/23/2015   HYPERCHOLESTEROLEMIA 04/24/2006   Major depressive disorder, recurrent episode ([place] 04/24/2006    Arlisa Leclere PNilda Simmer PT, MPH  03/31/2021, 10:23 AM  [facility] [address]Phone: [phone]  Fax:  [phone] Name: Cynthia WACHTERMRN: [id number] of Birth:  8Jul 12, 1972

## 2021-03-31 NOTE — Patient Instructions (Addendum)
Trigger Point Dry Needling  What is Trigger Point Dry Needling (DN)? DN is a physical therapy technique used to treat muscle pain and dysfunction. Specifically, DN helps deactivate muscle trigger points (muscle knots).  A thin filiform needle is used to penetrate the skin and stimulate the underlying trigger point. The goal is for a local twitch response (LTR) to occur and for the trigger point to relax. No medication of any kind is injected during the procedure.   What Does Trigger Point Dry Needling Feel Like?  The procedure feels different for each individual patient. Some patients report that they do not actually feel the needle enter the skin and overall the process is not painful. Very mild bleeding may occur. However, many patients feel a deep cramping in the muscle in which the needle was inserted. This is the local twitch response.   How Will I feel after the treatment? Soreness is normal, and the onset of soreness may not occur for a few hours. Typically this soreness does not last longer than two days.  Bruising is uncommon, however; ice can be used to decrease any possible bruising.  In rare cases feeling tired or nauseous after the treatment is normal. In addition, your symptoms may get worse before they get better, this period will typically not last longer than 24 hours.   What Can I do After My Treatment? Increase your hydration by drinking more water for the next 24 hours. You may place ice or heat on the areas treated that have become sore, however, do not use heat on inflamed or bruised areas. Heat often brings more relief post needling. You can continue your regular activities, but vigorous activity is not recommended initially after the treatment for 24 hours. DN is best combined with other physical therapy such as strengthening, stretching, and other therapies.  Access Code: [email or web address] URL: [email or web address] Date: 03/31/2021 Prepared by: Gillermo Murdoch  Exercises Seated Wrist Flexion Stretch (Mirrored) - 2-3 x daily - 7 x weekly - 3 reps - 15-30 seconds hold Wrist Circumduction AROM - 2-3 x daily - 7 x weekly - 1 sets - 10 reps Seated Scapular Retraction - 2-3 x daily - 7 x weekly - 1 sets - 5 reps - 5 seconds hold Seated Forearm Pronation and Supination AROM - 2-3 x daily - 7 x weekly - 1 sets - 10 reps Seated Triceps Extension - 2 x daily - 7 x weekly - 5 reps - 5-10 seconds hold Standing Bicep Stretch at Wall - 1 x daily - 7 x weekly - 2-3 reps - 20 seconds hold Doorway Pec Stretch at 60 Degrees Abduction - 3 x daily - 7 x weekly - 3 reps - 1 sets Doorway Pec Stretch at 90 Degrees Abduction - 3 x daily - 7 x weekly - 3 reps - 1 sets - 30 seconds hold Doorway Pec Stretch at 120 Degrees Abduction - 3 x daily - 7 x weekly - 3 reps - 1 sets - 30 second hold hold Supine Scapular Retraction - 2 x daily - 7 x weekly - 1 sets - 10 reps - 10 sec hold Seated Scapular Retraction - 2 x daily - 7 x weekly - 1-2 sets - 10 reps - 10 sec hold Seated Shoulder External Rotation - 2 x daily - 7 x weekly - 1-2 sets - 10 reps - 2-3 sec hold

## 2021-04-04 ENCOUNTER — Encounter: Payer: Self-pay | Admitting: Rehabilitative and Restorative Service Providers"

## 2021-04-04 ENCOUNTER — Ambulatory Visit: Payer: BC Managed Care – PPO | Admitting: Rehabilitative and Restorative Service Providers"

## 2021-04-04 ENCOUNTER — Other Ambulatory Visit: Payer: Self-pay

## 2021-04-04 DIAGNOSIS — M7711 Lateral epicondylitis, right elbow: Secondary | ICD-10-CM

## 2021-04-04 DIAGNOSIS — R29898 Other symptoms and signs involving the musculoskeletal system: Secondary | ICD-10-CM | POA: Diagnosis not present

## 2021-04-04 DIAGNOSIS — R293 Abnormal posture: Secondary | ICD-10-CM

## 2021-04-04 NOTE — Therapy (Signed)
[place] [address] [place], [place], [address] Phone: [phone]   Fax:  [phone]  Physical Therapy Treatment  Patient Details  Name: Cynthia [name] MRN: [id number] Date of Birth: 1970-08-22 Referring Provider (PT): Dr Dianah Field   Encounter Date: 04/04/2021   PT End of Session - 04/04/21 0847     Visit Number 4    Number of Visits 12    Date for PT Re-Evaluation 05/04/21    PT Start Time 0846    PT Stop Time 0934    PT Time Calculation (min) 48 min    Activity Tolerance Patient tolerated treatment well             Past Medical History:  Diagnosis Date   Depression    No pertinent past medical history     Past Surgical History:  Procedure Laterality Date   BREAST BIOPSY Right 07/16/2013   TONSILLECTOMY  1996   WISDOM TOOTH EXTRACTION  1990    There were no vitals filed for this visit.   Subjective Assessment - 04/04/21 0848     Subjective DN seems to help some. Tape is good. Just took the tape off this AM. Working on her exercises at home.    Currently in Pain? No/denies    Pain Score 0-No pain    Pain Location Elbow    Pain Orientation Right;Lateral    Pain Descriptors / Indicators Sore   stiff               [facility] PT Assessment - 04/04/21 0001       Assessment   Medical Diagnosis Rt lateral epicondylitis; torn extensor tendons Rt elbow    Referring Provider (PT) Dr Dianah Field    Onset Date/Surgical Date 01/28/21    Hand Dominance Right    Next MD Visit 05/03/21    Prior Therapy here for knee      AROM   Right Elbow Extension 0      Palpation   Palpation comment continued tightness and tenderness to palpation through the Rt extensor forearm into lateral epicondyle area and distal triceps area                           [facility] Adult PT Treatment/Exercise - 04/04/21 0001       Elbow Exercises   Other elbow exercises UBE x 2.5 min 2 min fwd; 30 sec  back - pain with backward      Shoulder Exercises: Supine   Other Supine Exercises scap retraction, 10 sec x 10      Shoulder Exercises: Prone   Other Prone Exercises prone series 10 sec x 5 reps arms at side; T 10 sec x 5 reps; goal post 10 sec x 5 reps      Shoulder Exercises: Stretch   Other Shoulder Stretches prolonged snow angel ~ 2 min      Wrist Exercises   Wrist Flexion Limitations wrist extensor stretch oving wrist toward little finger 10-15 sec x 3 reps    Other wrist exercises 3 way doorway stretch 30 sec x 2 reps each position      Moist Heat Therapy   Number Minutes Moist Heat 10 Minutes    Moist Heat Location Elbow   forearm     Manual Therapy   Manual Therapy Soft tissue mobilization;Taping    Manual therapy comments skilled palpation to assess response to DN and manual work  Soft tissue mobilization deep tissue work through the [employer] I strip of reg Rock tape applied to Rt lateral distal humerus to prox ulna; perpendicular strip on lateral supracondylar ridge              Trigger Point Dry Needling - 04/04/21 0001     Consent Given? Yes    Education Handout Provided Previously provided    Dry Needling Comments Rt    Electrical Stimulation Performed with Dry Needling Yes    Triceps Response Palpable increased muscle length;Twitch response elicited    Extensor carpi radialis longus/brevis Response Palpable increased muscle length;Twitch response elicited                   PT Education - 04/04/21 0908     Education Details HEP    Person(s) Educated Patient    Methods Explanation;Demonstration;Tactile cues;Verbal cues;Handout    Comprehension Verbalized understanding;Returned demonstration;Verbal cues required;Tactile cues required                 PT Long Term Goals - 03/23/21 1710       PT LONG TERM GOAL #1   Title Increase AROM Rt UE to WFL's in shoulder,  elbow, forearm    Time 6    Period Weeks    Status New    Target Date 05/04/21      PT LONG TERM GOAL #2   Title Return to all normal functional activities using Rt UE    Time 6    Period Weeks    Status New    Target Date 05/04/21      PT LONG TERM GOAL #3   Title Independent in HEP    Time 6    Period Weeks    Status New    Target Date 05/04/21      PT LONG TERM GOAL #4   Title Improve functional limitation score to 58    Time 6    Period Weeks    Status New    Target Date 05/04/21                   Plan - 04/04/21 0904     Clinical Impression Statement Decreased Rt arm pain; increased elbow ROM; decreased palpable tightness Rt extensor forearm; improved sleep (no longer awakening due to pain every night). Continued intermittent pain; mostly soreness and stiffness more than pain. Working on exercises at home. Added prone thoracic strengthening. Continued DN and manual work with good response. Continue use of tape for extensor forearm.    Rehab Potential Good    PT Frequency 2x / week    PT Duration 6 weeks    PT Treatment/Interventions ADLs/Self Care Home Management;Aquatic Therapy;Cryotherapy;Electrical Stimulation;Iontophoresis 51m/ml Dexamethasone;Moist Heat;Ultrasound;Therapeutic activities;Therapeutic exercise;Neuromuscular re-education;Patient/family education;Manual techniques;Passive range of motion;Dry needling;Taping;Vasopneumatic Device    PT Next Visit Plan continue manual work and DN; continue with eccentric strengthening; postural correction; modalities as indicated    PT Home Exercise Plan [email or web address]   Consulted and Agree with Plan of Care Patient             Patient will benefit from skilled therapeutic intervention in order to improve the following deficits and impairments:     Visit Diagnosis: Lateral epicondylitis of right elbow  Other symptoms and signs involving the musculoskeletal system  Abnormal posture     Problem  List Patient Active Problem  List   Diagnosis Date Noted   Lateral epicondylitis, right elbow 03/22/2021   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   Chronic right shoulder pain 03/11/2021   Migraine without status migrainosus, not intractable 12/27/2020   Trouble in sleeping 01/07/2020   Painless rectal bleeding 11/12/2019   Breast lump 11/12/2019   Anxiety 05/19/2019   Pyelonephritis of right kidney 12/31/2018   Weight gain 02/15/2017   Appetite increase 02/15/2017   Chronic migraine 02/23/2015   Bad dreams 02/23/2015   HYPERCHOLESTEROLEMIA 04/24/2006   Major depressive disorder, recurrent episode ([place]) 04/24/2006    Deshay Kirstein Nilda Simmer, PT,MPH  04/04/2021, 9:29 AM  [facility] [address] [place], [place], [address] Phone: [phone]   Fax:  [phone]  Name: Cynthia [name] MRN: [id number] Date of Birth: Aug 10, 1970

## 2021-04-04 NOTE — Patient Instructions (Signed)
Access Code: [email or web address] URL: [email or web address] Date: 04/04/2021 Prepared by: Gillermo Murdoch  Exercises Seated Wrist Flexion Stretch (Mirrored) - 2-3 x daily - 7 x weekly - 3 reps - 15-30 seconds hold Wrist Circumduction AROM - 2-3 x daily - 7 x weekly - 1 sets - 10 reps Seated Scapular Retraction - 2-3 x daily - 7 x weekly - 1 sets - 5 reps - 5 seconds hold Seated Forearm Pronation and Supination AROM - 2-3 x daily - 7 x weekly - 1 sets - 10 reps Seated Triceps Extension - 2 x daily - 7 x weekly - 5 reps - 5-10 seconds hold Standing Bicep Stretch at Wall - 1 x daily - 7 x weekly - 2-3 reps - 20 seconds hold Doorway Pec Stretch at 60 Degrees Abduction - 3 x daily - 7 x weekly - 3 reps - 1 sets Doorway Pec Stretch at 90 Degrees Abduction - 3 x daily - 7 x weekly - 3 reps - 1 sets - 30 seconds hold Doorway Pec Stretch at 120 Degrees Abduction - 3 x daily - 7 x weekly - 3 reps - 1 sets - 30 second hold hold Supine Scapular Retraction - 2 x daily - 7 x weekly - 1 sets - 10 reps - 10 sec hold Seated Scapular Retraction - 2 x daily - 7 x weekly - 1-2 sets - 10 reps - 10 sec hold Seated Shoulder External Rotation - 2 x daily - 7 x weekly - 1-2 sets - 10 reps - 2-3 sec hold Prone Scapular Retraction - 2 x daily - 7 x weekly - 1 sets - 5-10 reps - 3-5 sec hold Prone Scapular Retraction Arms at Side - 2 x daily - 7 x weekly - 1 sets - 5-10 reps - 3-5 sec hold Prone Scapular Retraction in Abduction - 2 x daily - 7 x weekly - 1 sets - 5-10 reps - 3-5 sec hold

## 2021-04-07 ENCOUNTER — Encounter: Payer: BC Managed Care – PPO | Admitting: Physical Therapy

## 2021-04-11 ENCOUNTER — Encounter: Payer: BC Managed Care – PPO | Admitting: Physical Therapy

## 2021-04-12 ENCOUNTER — Ambulatory Visit (INDEPENDENT_AMBULATORY_CARE_PROVIDER_SITE_OTHER): Payer: BC Managed Care – PPO | Admitting: Physical Therapy

## 2021-04-12 ENCOUNTER — Other Ambulatory Visit: Payer: Self-pay

## 2021-04-12 ENCOUNTER — Encounter: Payer: Self-pay | Admitting: Physical Therapy

## 2021-04-12 DIAGNOSIS — R293 Abnormal posture: Secondary | ICD-10-CM

## 2021-04-12 DIAGNOSIS — R29898 Other symptoms and signs involving the musculoskeletal system: Secondary | ICD-10-CM

## 2021-04-12 DIAGNOSIS — M7711 Lateral epicondylitis, right elbow: Secondary | ICD-10-CM

## 2021-04-12 NOTE — Therapy (Signed)
Cynthia Cynthia [name], [place], [address] Phone: [phone]   Fax:  [phone]  Physical Therapy Treatment  Patient Details  Name: Cynthia Cynthia [name] MRN: [id number] Date of Birth: 1971-01-24 Referring Provider (PT): Cynthia Cynthia [name]   Encounter Date: 04/12/2021   PT End of Session - 04/12/21 0849     Visit Number 5    Number of Visits 12    Date for PT Re-Evaluation 05/04/21    PT Start Time 0847    PT Stop Time 0930    PT Time Calculation (min) 43 min    Activity Tolerance Patient tolerated treatment well             Past Medical History:  Diagnosis Date   Depression    No pertinent past medical history     Past Surgical History:  Procedure Laterality Date   BREAST BIOPSY Right 07/16/2013   TONSILLECTOMY  1996   WISDOM TOOTH EXTRACTION  1990    There were no vitals filed for this visit.   Subjective Assessment - 04/12/21 0850     Subjective Pt reports that the DN last session really helped "open things up".  She continues to wake from pain in the night; does some exercises to ease the pain. She was able to lift water pitcher this weekend.  She reports 80% improvement.    Diagnostic tests Mild tendinosis of the common extensor tendon origin with an  interstitial tear.    Patient Stated Goals get rid og elbow pain and use Rt arm again    Currently in Pain? No/denies    Pain Score 0-No pain                [facility] PT Assessment - 04/12/21 0001       Assessment   Medical Diagnosis Rt lateral epicondylitis; torn extensor tendons Rt elbow    Referring Provider (PT) Cynthia Cynthia [name]    Onset Date/Surgical Date 01/28/21    Hand Dominance Right    Next MD Visit 05/03/21    Prior Therapy here for knee      AROM   Right Forearm Supination 92 Degrees      Strength   Strength Assessment Site Hand    Right/Left hand Right;Left    Right Hand Grip (lbs) 29   with pain   Left Hand Grip  (lbs) 66              [facility] Adult PT Treatment/Exercise - 04/12/21 0001       Exercises   Exercises Wrist;Elbow      Shoulder Exercises: Prone   Other Prone Exercises prone series: 10 sec x 5 reps arms at side; T 10 sec x 5 reps; goal post 10 sec x 5 reps      Shoulder Exercises: ROM/Strengthening   Nustep L3-5, arms only x 5 min for warm up.      Shoulder Exercises: Stretch   Other Shoulder Stretches wall pec stretch x 15 sec, 2 reps      Wrist Exercises   Wrist Flexion Limitations wrist extensor stretch moving wrist toward little finger 10-15 sec x 3 reps    Wrist Radial Deviation Strengthening;Right;10 reps    Bar Weights/Barbell (Radial Deviation) 2 lbs    Other wrist exercises yellow flex bar bending into [name] shape, U shape, wringing (wrist flex/ext).  2# Rt pronation/supination x 10    Other wrist exercises 3 way doorway stretch 15 sec x 2 reps  each position, bicep stretch holding door frame x 15 sec      Manual Therapy   Soft tissue mobilization IASTM/STM through the Rt extensor forearm, STM to Rt triceps brachii      Kinesiotix   [place] I strip of reg [place] tape applied to Rt lateral distal humerus to prox ulna; perpendicular strip on lateral supracondylar ridge               PT Long Term Goals - 04/12/21 0867       PT LONG TERM GOAL #1   Title Increase AROM Rt UE to WFL's in shoulder, elbow, forearm    Time 6    Period Weeks    Status On-going      PT LONG TERM GOAL #2   Title Return to all normal functional activities using Rt UE    Time 6    Period Weeks    Status On-going      PT LONG TERM GOAL #3   Title Independent in HEP    Time 6    Period Weeks    Status On-going      PT LONG TERM GOAL #4   Title Improve functional limitation score to 58    Time 6    Period Weeks    Status On-going                   Plan - 04/12/21 1014     Clinical Impression Statement Pt tolerated increased light resistance with wrist/forearm  strengthening; encouraged pt to mainly focus on stretches for wrist and elbow at this time.  Palpable tightness found in Rt distal lateral tricep, brachioradialis, and Rt wrist extensors; improved with STM to area.  Progressing well towards goals.    Rehab Potential Good    PT Frequency 2x / week    PT Duration 6 weeks    PT Treatment/Interventions ADLs/Self Care Home Management;Aquatic Therapy;Cryotherapy;Electrical Stimulation;Iontophoresis 71m/ml Dexamethasone;Moist Heat;Ultrasound;Therapeutic activities;Therapeutic exercise;Neuromuscular re-education;Patient/family education;Manual techniques;Passive range of motion;Dry needling;Taping;Vasopneumatic Device    PT Next Visit Plan continue manual work and DN; continue with eccentric strengthening; postural correction; modalities as indicated    PT Home Exercise Plan [email or web address]   Consulted and Agree with Plan of Care Patient             Patient will benefit from skilled therapeutic intervention in order to improve the following deficits and impairments:  Decreased range of motion, Increased fascial restricitons, Impaired UE functional use, Pain, Decreased activity tolerance, Impaired flexibility, Postural dysfunction, Decreased strength, Decreased mobility  Visit Diagnosis: Lateral epicondylitis of right elbow  Other symptoms and signs involving the musculoskeletal system  Abnormal posture     Problem List Patient Active Problem List   Diagnosis Date Noted   Lateral epicondylitis, right elbow 03/22/2021   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   Chronic right shoulder pain 03/11/2021   Migraine without status migrainosus, not intractable 12/27/2020   Trouble in sleeping 01/07/2020   Painless rectal bleeding 11/12/2019   Breast lump 11/12/2019   Anxiety 05/19/2019   Pyelonephritis of right kidney 12/31/2018   Weight gain 02/15/2017   Appetite increase 02/15/2017   Chronic migraine 02/23/2015   Bad dreams 02/23/2015    HYPERCHOLESTEROLEMIA 04/24/2006   Major depressive disorder, recurrent episode ([place] 04/24/2006   JKerin Perna [name] 04/12/21 12:51 PM   [place] [address]Phone: [phone]  Fax:  [phone] Name: Cynthia Cynthia [name]  Cynthia [name] MRN: [id number] Date of Birth: Oct 11, 1970

## 2021-04-14 ENCOUNTER — Encounter: Payer: BC Managed Care – PPO | Admitting: Rehabilitative and Restorative Service Providers"

## 2021-04-18 ENCOUNTER — Encounter: Payer: Self-pay | Admitting: Gastroenterology

## 2021-04-20 ENCOUNTER — Ambulatory Visit: Payer: BC Managed Care – PPO | Admitting: Rehabilitative and Restorative Service Providers"

## 2021-04-20 ENCOUNTER — Encounter: Payer: Self-pay | Admitting: Rehabilitative and Restorative Service Providers"

## 2021-04-20 ENCOUNTER — Other Ambulatory Visit: Payer: Self-pay

## 2021-04-20 DIAGNOSIS — R293 Abnormal posture: Secondary | ICD-10-CM

## 2021-04-20 DIAGNOSIS — R29898 Other symptoms and signs involving the musculoskeletal system: Secondary | ICD-10-CM | POA: Diagnosis not present

## 2021-04-20 DIAGNOSIS — M7711 Lateral epicondylitis, right elbow: Secondary | ICD-10-CM

## 2021-04-20 NOTE — Therapy (Signed)
[place], [place], [address] Phone: [phone]   Fax:  [phone]  Physical Therapy Treatment  Patient Details  Name: Cynthia [name] MRN: [id number] Date of Birth: 1971/02/14 Referring Provider (PT): Dr Dianah Field   Encounter Date: 04/20/2021   PT End of Session - 04/20/21 0847     Visit Number 6    Number of Visits 12    Date for PT Re-Evaluation 05/04/21    PT Start Time 0846    PT Stop Time 0935    PT Time Calculation (min) 49 min    Activity Tolerance Patient tolerated treatment well             Past Medical History:  Diagnosis Date   Depression    No pertinent past medical history     Past Surgical History:  Procedure Laterality Date   BREAST BIOPSY Right 07/16/2013   TONSILLECTOMY  1996   WISDOM TOOTH EXTRACTION  1990    There were no vitals filed for this visit.   Subjective Assessment - 04/20/21 0848     Subjective Doing much better. Still has some pain in the middle of the night. Can stretch and relieve the pain. She is using Rt UE for most ADL's - can lift a glass of water and her backpack.    Currently in Pain? No/denies    Pain Score 0-No pain   sometimes 4-5/10 but does not last long - resolves with stretching   Pain Location Elbow    Pain Orientation Right                [facility] PT Assessment - 04/20/21 0001       Assessment   Medical Diagnosis Rt lateral epicondylitis; torn extensor tendons Rt elbow    Referring Provider (PT) Dr Dianah Field    Onset Date/Surgical Date 01/28/21    Hand Dominance Right    Next MD Visit 05/03/21    Prior Therapy here for knee      Posture/Postural Control   Posture Comments improving posture with less head forward; shoulders rounded      AROM   Right Elbow Flexion 152    Right Elbow Extension 5    Left Elbow Flexion 155    Left Elbow Extension 5      Palpation   Palpation comment improved tightness and  tenderness to palpation through the Rt extensor forearm into lateral epicondyle area and distal triceps area                           [facility] Adult PT Treatment/Exercise - 04/20/21 0001       Shoulder Exercises: Supine   Other Supine Exercises scap retraction, 10 sec x 10      Shoulder Exercises: ROM/Strengthening   UBE (Upper Arm Bike) L4 x 4 min 2 min fwd/2 min back      Shoulder Exercises: Stretch   Other Shoulder Stretches wall pec stretch x 15 sec, 2 reps      Wrist Exercises   Wrist Flexion Limitations wrist extensor stretch toward little finger 10-15 sec x 3 reps    Other wrist exercises yellow flex bar tennis elbow series exercise per instruction sheet; U shape each direction bending x 10 reps    Other wrist exercises 3 way doorway stretch 15 sec x 2 reps each position, bicep stretch holding door frame x 15 sec  Moist Heat Therapy   Number Minutes Moist Heat 10 Minutes    Moist Heat Location Elbow   forearm     Manual Therapy   Manual Therapy Soft tissue mobilization;Taping    Manual therapy comments skilled palpation to assess response to DN and manual work    Soft tissue mobilization deep tissue work Rt extensor forearm, STM to Rt triceps brachii    Kinesiotex IT sales professional I strip of reg [place] tape applied to Rt lateral distal humerus to prox ulna; perpendicular strip on lateral supracondylar ridge              Trigger Point Dry Needling - 04/20/21 0001     Consent Given? Yes    Education Handout Provided Previously provided    Dry Needling Comments Rt    Electrical Stimulation Performed with Dry Needling Yes    Triceps Response Palpable increased muscle length;Twitch response elicited    Extensor carpi radialis longus/brevis Response Palpable increased muscle length;Twitch response elicited                        PT Long Term Goals - 04/12/21 0852       PT LONG TERM GOAL #1   Title  Increase AROM Rt UE to WFL's in shoulder, elbow, forearm    Time 6    Period Weeks    Status On-going      PT LONG TERM GOAL #2   Title Return to all normal functional activities using Rt UE    Time 6    Period Weeks    Status On-going      PT LONG TERM GOAL #3   Title Independent in HEP    Time 6    Period Weeks    Status On-going      PT LONG TERM GOAL #4   Title Improve functional limitation score to 58    Time 6    Period Weeks    Status On-going                   Plan - 04/20/21 0905     Clinical Impression Statement Good improvement with patient reporting only intermittent pain most often during the night with symptoms resolving with stretching. She demonstrates good gains in AROM Rt UE; less palpable tightness through the Rt UE. Progressing well toward stated goals of therapy. MD 05/03/21 - note to MD next visit    Rehab Potential Good    PT Frequency 2x / week    PT Duration 6 weeks    PT Treatment/Interventions ADLs/Self Care Home Management;Aquatic Therapy;Cryotherapy;Electrical Stimulation;Iontophoresis 43m/ml Dexamethasone;Moist Heat;Ultrasound;Therapeutic activities;Therapeutic exercise;Neuromuscular re-education;Patient/family education;Manual techniques;Passive range of motion;Dry needling;Taping;Vasopneumatic Device    PT Next Visit Plan continue manual work and DN; continue with eccentric strengthening; postural correction; modalities as indicated - note to MD    PT [place] Agree with Plan of Care Patient             Patient will benefit from skilled therapeutic intervention in order to improve the following deficits and impairments:     Visit Diagnosis: Lateral epicondylitis of right elbow  Other symptoms and signs involving the musculoskeletal system  Abnormal posture     Problem List Patient Active Problem List   Diagnosis Date Noted   Lateral epicondylitis, right elbow 03/22/2021   Enchondroma  of bone 03/11/2021  Chronic right shoulder pain 03/11/2021   Migraine without status migrainosus, not intractable 12/27/2020   Trouble in sleeping 01/07/2020   Painless rectal bleeding 11/12/2019   Breast lump 11/12/2019   Anxiety 05/19/2019   Pyelonephritis of right kidney 12/31/2018   Weight gain 02/15/2017   Appetite increase 02/15/2017   Chronic migraine 02/23/2015   Bad dreams 02/23/2015   HYPERCHOLESTEROLEMIA 04/24/2006   Major depressive disorder, recurrent episode ([place]) 04/24/2006    Erasto Sleight Nilda Simmer, PT, MPH  04/20/2021, 9:37 AM  [facility] [place], [place], [address] Phone: [phone]   Fax:  [phone]  Name: RENDI MAPEL MRN: [id number] Date of Birth: 1970/09/01

## 2021-04-28 ENCOUNTER — Ambulatory Visit: Payer: BC Managed Care – PPO | Admitting: Rehabilitative and Restorative Service Providers"

## 2021-04-28 ENCOUNTER — Encounter: Payer: Self-pay | Admitting: Rehabilitative and Restorative Service Providers"

## 2021-04-28 ENCOUNTER — Other Ambulatory Visit: Payer: Self-pay

## 2021-04-28 DIAGNOSIS — M7711 Lateral epicondylitis, right elbow: Secondary | ICD-10-CM

## 2021-04-28 DIAGNOSIS — R293 Abnormal posture: Secondary | ICD-10-CM | POA: Diagnosis not present

## 2021-04-28 DIAGNOSIS — R29898 Other symptoms and signs involving the musculoskeletal system: Secondary | ICD-10-CM | POA: Diagnosis not present

## 2021-04-28 NOTE — Therapy (Signed)
[place], [place], [address] Phone: [phone]   Fax:  [phone]  Physical Therapy Treatment  Patient Details  Name: Cynthia [name] MRN: [id number] Date of Birth: [birth year] Referring Provider (PT): Dr Dianah Field   Encounter Date: 04/28/2021   PT End of Session - 04/28/21 1538     Visit Number 7    Number of Visits 12    Date for PT Re-Evaluation 05/04/21    PT Start Time 1537    PT Stop Time 9702    PT Time Calculation (min) 38 min    Activity Tolerance Patient tolerated treatment well             Past Medical History:  Diagnosis Date   Depression    No pertinent past medical history     Past Surgical History:  Procedure Laterality Date   BREAST BIOPSY Right 07/16/2013   TONSILLECTOMY  1996   WISDOM TOOTH EXTRACTION  1990    There were no vitals filed for this visit.   Subjective Assessment - 04/28/21 1539     Subjective Stiff and achy this week. Went biking this past weekend for ~ 2 hours and noticed increased pain about an hour into the ride and following the ride.    Currently in Pain? Yes    Pain Score 2     Pain Location Elbow                [facility] PT Assessment - 04/28/21 0001       Assessment   Medical Diagnosis Rt lateral epicondylitis; torn extensor tendons Rt elbow    Referring Provider (PT) Dr Dianah Field    Onset Date/Surgical Date 01/28/21    Hand Dominance Right    Next MD Visit 05/03/21    Prior Therapy here for knee      AROM   Right Elbow Flexion 152    Right Elbow Extension 5    Left Elbow Flexion 155    Left Elbow Extension 5    Right Forearm Supination 92 Degrees      Strength   Right/Left hand Right;Left    Right Hand Grip (lbs) 60   less pain than prior testing   Left Hand Grip (lbs) 66      Palpation   Palpation comment improved tightness and tenderness to palpation through the Rt extensor forearm into lateral epicondyle  area and distal triceps area                           [facility] Adult PT Treatment/Exercise - 04/28/21 0001       Posture/Postural Control   Posture Comments improving posture with less head forward; shoulders rounded      Shoulder Exercises: Supine   Other Supine Exercises scap retraction, 10 sec x 10      Shoulder Exercises: Prone   Other Prone Exercises prone series: 10 sec x 5 reps arms at side; T 10 sec x 5 reps; goal post 10 sec x 5 reps      Shoulder Exercises: ROM/Strengthening   UBE (Upper Arm Bike) L4 x 4 min 2 min fwd/2 min back      Shoulder Exercises: Stretch   Other Shoulder Stretches wall pec stretch x 15 sec, 2 reps      Wrist Exercises   Wrist Flexion Limitations wrist extensor stretch with wrist toward little finger 10-15 sec x 3 reps  Wrist Radial Deviation Strengthening;Right;10 reps    Bar Weights/Barbell (Radial Deviation) 2 lbs    Wrist Radial Deviation Limitations eccentric    Other wrist exercises yellow flex bar tennis elbow series exercise per instruction sheet; U shape each direction bending x 10 reps    Other wrist exercises 3 way doorway stretch 15 sec x 2 reps each position, bicep stretch holding door frame x 15 sec      Moist Heat Therapy   Number Minutes Moist Heat 10 Minutes    Moist Heat Location Elbow   forearm     Electrical Stimulation   Electrical Stimulation Location Rt extensor forearm/triceps    Electrical Stimulation Action mAp and micro current    Electrical Stimulation Parameters post DN/manual work    Printmaker Goals Pain;Tone      Manual Therapy   Manual Therapy Soft tissue mobilization;Taping    Manual therapy comments skilled palpation to assess response to DN and manual work    Soft tissue mobilization deep tissue work Rt extensor forearm, STM to Rt triceps brachii    [place] I strip of reg [place] tape applied to Rt lateral distal humerus to prox  ulna; perpendicular strip on lateral supracondylar ridge              Trigger Point Dry Needling - 04/28/21 0001     Consent Given? Yes    Education Handout Provided Previously provided    Dry Needling Comments Rt    Electrical Stimulation Performed with Dry Needling Yes    Triceps Response Palpable increased muscle length;Twitch response elicited    Extensor carpi radialis longus/brevis Response Palpable increased muscle length;Twitch response elicited                        PT Long Term Goals - 04/12/21 0852       PT LONG TERM GOAL #1   Title Increase AROM Rt UE to WFL's in shoulder, elbow, forearm    Time 6    Period Weeks    Status On-going      PT LONG TERM GOAL #2   Title Return to all normal functional activities using Rt UE    Time 6    Period Weeks    Status On-going      PT LONG TERM GOAL #3   Title Independent in HEP    Time 6    Period Weeks    Status On-going      PT LONG TERM GOAL #4   Title Improve functional limitation score to 58    Time 6    Period Weeks    Status On-going                   Plan - 04/28/21 1609     Clinical Impression Statement Patient has progressed well with treatment for Rt lateral epicondylitis. She has return to full ROM; increased grip strength; improved palpable tightness in the extensor forearm and triceps. She continues to have some pain and continued weakness with gripping on Rt; tenderness and tightness in the extensor forearm; pain with functional activities. Se will benefit from continued treatment to achieve full resolution of symptoms.    Rehab Potential Good    PT Frequency 2x / week    PT Duration 6 weeks    PT Treatment/Interventions ADLs/Self Care Home Management;Aquatic Therapy;Cryotherapy;Electrical Stimulation;Iontophoresis 2m/ml Dexamethasone;Moist Heat;Ultrasound;Therapeutic activities;Therapeutic exercise;Neuromuscular  re-education;Patient/family education;Manual  techniques;Passive range of motion;Dry needling;Taping;Vasopneumatic Device    PT Next Visit Plan continue manual work and DN; continue with eccentric strengthening; postural correction; modalities as indicated - note to MD    PT [place] and Agree with Plan of Care Patient             Patient will benefit from skilled therapeutic intervention in order to improve the following deficits and impairments:     Visit Diagnosis: Lateral epicondylitis of right elbow  Other symptoms and signs involving the musculoskeletal system  Abnormal posture     Problem List Patient Active Problem List   Diagnosis Date Noted   Lateral epicondylitis, right elbow 03/22/2021   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   Chronic right shoulder pain 03/11/2021   Migraine without status migrainosus, not intractable 12/27/2020   Trouble in sleeping 01/07/2020   Painless rectal bleeding 11/12/2019   Breast lump 11/12/2019   Anxiety 05/19/2019   Pyelonephritis of right kidney 12/31/2018   Weight gain 02/15/2017   Appetite increase 02/15/2017   Chronic migraine 02/23/2015   Bad dreams 02/23/2015   HYPERCHOLESTEROLEMIA 04/24/2006   Major depressive disorder, recurrent episode ([place]) 04/24/2006    Dewana Ammirati Nilda Simmer, PT, MPH  04/28/2021, 4:19 PM  [facility] [place], [place], [address] Phone: [phone]   Fax:  [phone]  Name: JANDI SWIGER MRN: [id number] Date of Birth: 02-17-1971

## 2021-05-03 ENCOUNTER — Other Ambulatory Visit: Payer: Self-pay

## 2021-05-03 ENCOUNTER — Ambulatory Visit: Payer: BC Managed Care – PPO | Admitting: Sports Medicine

## 2021-05-03 DIAGNOSIS — M7711 Lateral epicondylitis, right elbow: Secondary | ICD-10-CM

## 2021-05-03 MED ORDER — NITROGLYCERIN 0.2 MG/HR TD PT24: MEDICATED_PATCH | TRANSDERMAL | 11 refills | Status: DC

## 2021-05-03 NOTE — Assessment & Plan Note (Signed)
Cynthia [name] returns, she is a very pleasant 50 year old female, diagnosed with an interstitial tear of the right common extensor tendon origin consistent with tennis elbow, she has improved considerably with physical therapy, naproxen. Did have a slight exacerbation of pain after some yard work, but is very excited to do some more therapy, I am also going to add a topical nitroglycerin patch. Will do all of the above for another 6 weeks and then return to see me, we can consider PRP if not sufficiently better at the follow-up.

## 2021-05-03 NOTE — Progress Notes (Signed)
    Procedures performed today:    None.  Independent interpretation of notes and tests performed by another provider:   None.  Brief History, Exam, Impression, and Recommendations:    Lateral epicondylitis, right elbow Jahanna returns, she is a very pleasant 50 year old female, diagnosed with an interstitial tear of the right common extensor tendon origin consistent with tennis elbow, she has improved considerably with physical therapy, naproxen. Did have a slight exacerbation of pain after some yard work, but is very excited to do some more therapy, I am also going to add a topical nitroglycerin patch. Will do all of the above for another 6 weeks and then return to see me, we can consider PRP if not sufficiently better at the follow-up.  Chronic process with exacerbation and pharmacologic intervention.  ___________________________________________ Gwen Her. Dianah Field, M.D., ABFM., CAQSM. Primary Care and [place] Instructor of [place] [facility]

## 2021-05-20 ENCOUNTER — Ambulatory Visit: Payer: BC Managed Care – PPO | Admitting: Physical Therapy

## 2021-05-20 ENCOUNTER — Other Ambulatory Visit: Payer: Self-pay

## 2021-05-20 DIAGNOSIS — M7711 Lateral epicondylitis, right elbow: Secondary | ICD-10-CM

## 2021-05-20 DIAGNOSIS — R29898 Other symptoms and signs involving the musculoskeletal system: Secondary | ICD-10-CM | POA: Diagnosis not present

## 2021-05-20 DIAGNOSIS — R293 Abnormal posture: Secondary | ICD-10-CM

## 2021-05-20 NOTE — Therapy (Signed)
[place], [place], [address] Phone: [phone]   Fax:  [phone]  Physical Therapy Treatment and Re-Cert  Patient Details  Name: Cynthia [name] MRN: [id number] Date of Birth: November 04, 1970 Referring Provider (PT): Dr Dianah Field   Encounter Date: 05/20/2021   PT End of Session - 05/20/21 1248     Visit Number 8    Date for PT Re-Evaluation 07/01/21    Authorization Type BCBS    PT Start Time 1150    PT Stop Time 1230    PT Time Calculation (min) 40 min    Activity Tolerance Patient tolerated treatment well    Behavior During Therapy [facility] for tasks assessed/performed             Past Medical History:  Diagnosis Date   Depression    No pertinent past medical history     Past Surgical History:  Procedure Laterality Date   BREAST BIOPSY Right 07/16/2013   TONSILLECTOMY  1996   WISDOM TOOTH EXTRACTION  1990    There were no vitals filed for this visit.   Subjective Assessment - 05/20/21 1151     Subjective Pt states in the past few days it was the worse it had been for a while. Pt has continued to do stretches. Reports continued pain in wrist extensors. Pt states she has been using nitroglycerin patch but isn't sure if it's doing anything. Pt states at its worst it wakes her up when she's sleeping.    Pertinent History anxiety; knee pain    Diagnostic tests Mild tendinosis of the common extensor tendon origin with an  interstitial tear.    Patient Stated Goals get rid og elbow pain and use Rt arm again    Currently in Pain? Yes   at worst 7 or an 8   Pain Score 2     Pain Location Elbow    Pain Orientation Right    Pain Descriptors / Indicators Aching;Sore                [facility] PT Assessment - 05/20/21 0001       Observation/Other Assessments   Focus on Therapeutic Outcomes (FOTO)  50      AROM   Right Elbow Flexion 152    Right Elbow Extension 5    Right/Left Wrist  Left    Right Wrist Extension 63 Degrees    Right Wrist Flexion 74 Degrees    Left Wrist Extension 78 Degrees    Left Wrist Flexion 76 Degrees      Strength   Right Hand Grip (lbs) 30, 40, 50    Left Hand Grip (lbs) 70, 65, 67                           [facility] Adult PT Treatment/Exercise - 05/20/21 0001       Elbow Exercises   Forearm Supination AROM;Right;Seated;10 reps      Wrist Exercises   Wrist Flexion Limitations wrist extensor stretch with wrist toward little finger 10-15 sec x 3 reps    Wrist Radial Deviation Strengthening;Right;10 reps   with gripping   Wrist Radial Deviation Limitations eccentric    Wrist Ulnar Deviation Strengthening;Right;10 reps;Seated   with towel squeeze   Wrist Ulnar Deviation Limitations eccentric      Manual Therapy   Manual Therapy Joint mobilization    Joint Mobilization Gentle radius PA mobs and supination  Soft tissue mobilization deep tissue work Rt extensor forearm, STM to Rt triceps brachii; IASTM extensors    Kinesiotex Facilitate Muscle      Kinesiotix   Facilitate Muscle  I strip along wrist extensors                     PT Education - 05/20/21 1241     Education Details Discussed computer ergonomics and posture as this may be contributing to continued inflammation. Discussed ergonomic mouse and wrist/elbow positioning    Person(s) Educated Patient    Methods Explanation;Demonstration;Tactile cues;Verbal cues;Handout    Comprehension Verbalized understanding;Returned demonstration;Verbal cues required;Tactile cues required                 PT Long Term Goals - 05/20/21 1257       PT LONG TERM GOAL #1   Title Increase AROM Rt UE to WFL's in shoulder, elbow, forearm    Time 6    Period Weeks    Status Revised    Target Date 07/01/21      PT LONG TERM GOAL #2   Title Return to all normal functional activities using Rt UE    Time 6    Period Weeks    Status Revised    Target Date  07/01/21      PT LONG TERM GOAL #3   Title Independent in HEP    Time 6    Period Weeks    Status Partially Met      PT LONG TERM GOAL #4   Title Improve functional limitation score to 58    Baseline FOTO now to 50 on visit 8    Time 6    Period Weeks    Status Partially Met                   Plan - 05/20/21 1246     Clinical Impression Statement Pt returns for re-cert after 3 week hiatus from therapy. Pt with full elbow ROM and improved FOTO score; however, wrist motion remains limited with continued wrist extensor weakness and tightness. Grip strength on R has decreased somewhat since last PT visit. 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 Provided k-tape for increased awareness and facilitating wrist extensors.    Stability/Clinical Decision Making Stable/Uncomplicated    Clinical Decision Making Low    Rehab Potential Good    PT Frequency 1x / week   Requested 1x/wk   PT Duration 6 weeks    PT Treatment/Interventions ADLs/Self Care Home Management;Aquatic Therapy;Cryotherapy;Electrical Stimulation;Iontophoresis 46m/ml Dexamethasone;Moist Heat;Ultrasound;Therapeutic activities;Therapeutic exercise;Neuromuscular re-education;Patient/family education;Manual techniques;Passive range of motion;Dry needling;Taping;Vasopneumatic Device    PT Next Visit Plan continue manual work and DN; continue with eccentric strengthening; postural correction; modalities as indicated    PT Home Exercise Plan [id number]   Consulted and Agree with Plan of Care Patient             Patient will benefit from skilled therapeutic intervention in order to improve the following deficits and impairments:  Decreased range of motion, Increased fascial restricitons, Impaired UE functional use, Pain, Decreased activity tolerance, Impaired flexibility, Postural dysfunction, Decreased  strength, Decreased mobility  Visit Diagnosis: Lateral epicondylitis of right elbow  Other symptoms and signs involving the musculoskeletal system  Abnormal posture     Problem List Patient Active Problem List   Diagnosis Date Noted   Lateral  epicondylitis, right elbow 03/22/2021   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   Chronic right shoulder pain 03/11/2021   Migraine without status migrainosus, not intractable 12/27/2020   Trouble in sleeping 01/07/2020   Painless rectal bleeding 11/12/2019   Breast lump 11/12/2019   Anxiety 05/19/2019   Pyelonephritis of right kidney 12/31/2018   Weight gain 02/15/2017   Appetite increase 02/15/2017   Chronic migraine 02/23/2015   Bad dreams 02/23/2015   HYPERCHOLESTEROLEMIA 04/24/2006   Major depressive disorder, recurrent episode [facility]) 04/24/2006    [facility] April Gordy Levan, PT, DPT 05/20/2021, 12:57 PM  [facility] [place], [place], [address] Phone: [phone]   Fax:  [phone]  Name: KEMPER HOCHMAN MRN: [id number] Date of Birth: [birth year]

## 2021-05-26 ENCOUNTER — Encounter: Payer: BC Managed Care – PPO | Admitting: Rehabilitative and Restorative Service Providers"

## 2021-06-03 ENCOUNTER — Encounter: Payer: Self-pay | Admitting: Rehabilitative and Restorative Service Providers"

## 2021-06-03 ENCOUNTER — Ambulatory Visit: Payer: BC Managed Care – PPO | Admitting: Rehabilitative and Restorative Service Providers"

## 2021-06-03 ENCOUNTER — Other Ambulatory Visit: Payer: Self-pay

## 2021-06-03 DIAGNOSIS — R293 Abnormal posture: Secondary | ICD-10-CM

## 2021-06-03 DIAGNOSIS — R29898 Other symptoms and signs involving the musculoskeletal system: Secondary | ICD-10-CM | POA: Diagnosis not present

## 2021-06-03 DIAGNOSIS — M7711 Lateral epicondylitis, right elbow: Secondary | ICD-10-CM | POA: Diagnosis not present

## 2021-06-03 NOTE — Therapy (Signed)
[place], [place], [address] Phone: [phone]   Fax:  [phone]  Physical Therapy Treatment  Patient Details  Name: Cynthia [name] MRN: [id number] Date of Birth: [birth year] Referring Provider (PT): Dr Dianah Field   Encounter Date: 06/03/2021   PT End of Session - 06/03/21 0848     Visit Number 9    Number of Visits 12    Date for PT Re-Evaluation 07/01/21    Authorization Type BCBS    PT Start Time 6599    PT Stop Time 0935    PT Time Calculation (min) 48 min             Past Medical History:  Diagnosis Date   Depression    No pertinent past medical history     Past Surgical History:  Procedure Laterality Date   BREAST BIOPSY Right 07/16/2013   TONSILLECTOMY  1996   WISDOM TOOTH EXTRACTION  1990    There were no vitals filed for this visit.   Subjective Assessment - 06/03/21 0851     Subjective This week has not been good. not sure what she did. Waking her up at night again. Can stretch and relieve pain and get back to sleep. Up to 3/10 with movement today.    Currently in Pain? Yes    Pain Score 3     Pain Location Elbow    Pain Orientation Right    Pain Descriptors / Indicators Aching;Sore                [facility] PT Assessment - 06/03/21 0001       Assessment   Medical Diagnosis Rt lateral epicondylitis; torn extensor tendons Rt elbow    Referring Provider (PT) Dr Dianah Field    Onset Date/Surgical Date 01/28/21    Hand Dominance Right    Next MD Visit 06/14/21    Prior Therapy here for knee      Palpation   Palpation comment banding, tightness and tenderness to palpation through the Rt extensor forearm into lateral epicondyle and distal triceps area                           [facility] Adult PT Treatment/Exercise - 06/03/21 0001       Elbow Exercises   Forearm Supination AROM;Right;Seated;10 reps      Wrist Exercises   Wrist Ulnar  Deviation PROM;Right   3 reps 10-15 sec hold   Wrist Ulnar Deviation Limitations stretching into ulnar deviation      Moist Heat Therapy   Number Minutes Moist Heat 10 Minutes    Moist Heat Location Elbow   forearm     Electrical Stimulation   Electrical Stimulation Location Rt extensor forearm    Electrical Stimulation Action mAmp x 5 ; microcurrent x 5    Electrical Stimulation Parameters to tolerance    Electrical Stimulation Goals Pain;Tone      Manual Therapy   Manual therapy comments skilled palpation to assess response to DN and manual work    Joint Mobilization gentle radius PA mobs and supination    Soft tissue mobilization deep tissue work Rt extensor forearm, STM to Rt triceps brachii; IASTM extensors      Kinesiotix   Create Space I strip of reg Rock tape applied to Rt lateral distal humerus to prox ulna; perpendicular strip on lateral supracondylar ridge  Trigger Point Dry Needling - 06/03/21 0001     Consent Given? Yes    Education Handout Provided Previously provided    Dry Needling Comments Rt    Electrical Stimulation Performed with Dry Needling Yes    Pronator teres Response Palpable increased muscle length;Twitch response elicited    Extensor carpi radialis longus/brevis Response Palpable increased muscle length;Twitch response elicited                        PT Long Term Goals - 05/20/21 1257       PT LONG TERM GOAL #1   Title Increase AROM Rt UE to WFL's in shoulder, elbow, forearm    Time 6    Period Weeks    Status Revised    Target Date 07/01/21      PT LONG TERM GOAL #2   Title Return to all normal functional activities using Rt UE    Time 6    Period Weeks    Status Revised    Target Date 07/01/21      PT LONG TERM GOAL #3   Title Independent in HEP    Time 6    Period Weeks    Status Partially Met      PT LONG TERM GOAL #4   Title Improve functional limitation score to 58    Baseline FOTO now to 50 on  visit 8    Time 6    Period Weeks    Status Partially Met                   Plan - 06/03/21 0909     Clinical Impression Statement Increased forearm pain in the Rt forearm with no known cause. She has increased palpable tightness through the Rt forearm into pronator teres musculature as well as extensor musculature. Good response to DN and manual work.    Rehab Potential Good    PT Frequency 2x / week    PT Duration 6 weeks    PT Treatment/Interventions ADLs/Self Care Home Management;Aquatic Therapy;Cryotherapy;Electrical Stimulation;Iontophoresis 47m/ml Dexamethasone;Moist Heat;Ultrasound;Therapeutic activities;Therapeutic exercise;Neuromuscular re-education;Patient/family education;Manual techniques;Passive range of motion;Dry needling;Taping;Vasopneumatic Device    PT Next Visit Plan continue manual work and DN; continue with eccentric strengthening; postural correction; modalities as indicated    PT Home Exercise Plan [id number]   Consulted and Agree with Plan of Care Patient             Patient will benefit from skilled therapeutic intervention in order to improve the following deficits and impairments:     Visit Diagnosis: Lateral epicondylitis of right elbow  Other symptoms and signs involving the musculoskeletal system  Abnormal posture     Problem List Patient Active Problem List   Diagnosis Date Noted   Lateral epicondylitis, right elbow 03/22/2021   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   Chronic right shoulder pain 03/11/2021   Migraine without status migrainosus, not intractable 12/27/2020   Trouble in sleeping 01/07/2020   Painless rectal bleeding 11/12/2019   Breast lump 11/12/2019   Anxiety 05/19/2019   Pyelonephritis of right kidney 12/31/2018   Weight gain 02/15/2017   Appetite increase 02/15/2017   Chronic migraine 02/23/2015   Bad dreams 02/23/2015   HYPERCHOLESTEROLEMIA 04/24/2006   Major depressive disorder, recurrent episode ([place]  04/24/2006    Cynthia [name] Cynthia [name] PT, MPH  06/03/2021, 9:28 AM  [facility] [address]Phone: [phone]  Fax:  [phone]  Name: Cynthia [name] MRN: [id number] Date of Birth: 04-28-1971

## 2021-06-08 ENCOUNTER — Other Ambulatory Visit: Payer: Self-pay

## 2021-06-08 ENCOUNTER — Encounter: Payer: Self-pay | Admitting: Rehabilitative and Restorative Service Providers"

## 2021-06-08 ENCOUNTER — Ambulatory Visit: Payer: BC Managed Care – PPO | Admitting: Rehabilitative and Restorative Service Providers"

## 2021-06-08 DIAGNOSIS — R293 Abnormal posture: Secondary | ICD-10-CM | POA: Diagnosis not present

## 2021-06-08 DIAGNOSIS — M7711 Lateral epicondylitis, right elbow: Secondary | ICD-10-CM | POA: Diagnosis not present

## 2021-06-08 DIAGNOSIS — R29898 Other symptoms and signs involving the musculoskeletal system: Secondary | ICD-10-CM

## 2021-06-08 NOTE — Therapy (Addendum)
[place], [place], [address] Phone: [phone]   Fax:  [phone]  Physical Therapy Treatment and Discharge Summary  PHYSICAL THERAPY DISCHARGE SUMMARY  Visits from Start of Care: 10 Continued pain on an intermittent basis   Education / Equipment: HEP    Patient agrees to discharge. Patient goals were partially met. Patient is being discharged due to not returning since the last visit.  Jaxan Michel P. Helene Kelp PT, MPH 07/22/21 11:33 AM    Patient Details  Name: Cynthia [name] MRN: [id number] Date of Birth: 09-08-1970 Referring Provider (PT): Dr Dianah Field   Encounter Date: 06/08/2021   PT End of Session - 06/08/21 1147     Visit Number 10    Number of Visits 12    Date for PT Re-Evaluation 07/01/21    Authorization Type BCBS    PT Start Time 1147    PT Stop Time 3007    PT Time Calculation (min) 48 min    Activity Tolerance Patient tolerated treatment well             Past Medical History:  Diagnosis Date   Depression    No pertinent past medical history     Past Surgical History:  Procedure Laterality Date   BREAST BIOPSY Right 07/16/2013   TONSILLECTOMY  1996   WISDOM TOOTH EXTRACTION  1990    There were no vitals filed for this visit.   Subjective Assessment - 06/08/21 1148     Subjective Rough week with increased pain all week. Today is the best day she has had. Still having some pain.    Currently in Pain? Yes    Pain Score 2     Pain Location Elbow    Pain Orientation Right    Pain Descriptors / Indicators Aching;Sore    Pain Type Chronic pain    Pain Onset More than a month ago    Pain Frequency Intermittent                [facility] PT Assessment - 06/08/21 0001       Assessment   Medical Diagnosis Rt lateral epicondylitis; torn extensor tendons Rt elbow    Referring Provider (PT) Dr Dianah Field    Onset Date/Surgical Date 01/28/21    Hand  Dominance Right    Next MD Visit 06/14/21    Prior Therapy here for knee      AROM   Right Elbow Flexion 152    Right Elbow Extension 5    Right/Left Wrist Left    Right Wrist Extension 65 Degrees    Right Wrist Flexion 74 Degrees    Left Wrist Extension 78 Degrees    Left Wrist Flexion 76 Degrees      Palpation   Palpation comment banding, tightness and tenderness to palpation through the Rt extensor forearm into lateral epicondyle and distal triceps area                           [facility] Adult PT Treatment/Exercise - 06/08/21 0001       Self-Care   Self-Care Other Self-Care Comments    Other Self-Care Comments  discussed use of air cast lateral epicondyle brace; modifying use of mouse; monitoring activities of Rt UE for provoking tasks      Wrist Exercises   Wrist Flexion Limitations wrist extensor stretch into ulnar deviation 15-20 sec hold x 3 reps    Wrist  Extension Strengthening;Right;5 reps    Bar Weights/Barbell (Wrist Extension) 1 lb    Wrist Extension Limitations eccentric    Wrist Radial Deviation Strengthening;Right;10 reps    Bar Weights/Barbell (Radial Deviation) 1 lb    Wrist Radial Deviation Limitations eccentric    Wrist Ulnar Deviation PROM;Right   3 reps 10-15 sec hold   Wrist Ulnar Deviation Limitations eccentric      Manual Therapy   Manual therapy comments skilled palpation to assess response to DN and manual work    Joint Mobilization radius PA mobs and supination    Soft tissue mobilization deep tissue work Rt extensor forearm, STM to Rt triceps brachii; IASTM extensors    Kinesiotex Inhibit Muscle      Kinesiotix   Create Space I strip of reg Rock tape applied to Rt lateral distal humerus to prox ulna; perpendicular strip on lateral supracondylar ridge              Trigger Point Dry Needling - 06/08/21 0001     Consent Given? Yes    Education Handout Provided Previously provided    Dry Needling Comments Rt    Pronator  teres Response Palpable increased muscle length;Twitch response elicited                        PT Long Term Goals - 05/20/21 1257       PT LONG TERM GOAL #1   Title Increase AROM Rt UE to WFL's in shoulder, elbow, forearm    Time 6    Period Weeks    Status Revised    Target Date 07/01/21      PT LONG TERM GOAL #2   Title Return to all normal functional activities using Rt UE    Time 6    Period Weeks    Status Revised    Target Date 07/01/21      PT LONG TERM GOAL #3   Title Independent in HEP    Time 6    Period Weeks    Status Partially Met      PT LONG TERM GOAL #4   Title Improve functional limitation score to 58    Baseline FOTO now to 50 on visit 8    Time 6    Period Weeks    Status Partially Met                   Plan - 06/08/21 1239     Clinical Impression Statement Improving Rt forearm pain through the week. Continues to have intermittent pain which is increased with functional activities. Working on IASTM/deep tissue work/DN Rt extensor forearm. Note decreased muscular tightness to palpation through the arm following treatment. Continued with eccentric strengthening to pt tolerance. Taping.    Rehab Potential Good    PT Frequency 2x / week    PT Duration 6 weeks    PT Treatment/Interventions ADLs/Self Care Home Management;Aquatic Therapy;Cryotherapy;Electrical Stimulation;Iontophoresis 4mg/ml Dexamethasone;Moist Heat;Ultrasound;Therapeutic activities;Therapeutic exercise;Neuromuscular re-education;Patient/family education;Manual techniques;Passive range of motion;Dry needling;Taping;Vasopneumatic Device    PT Next Visit Plan continue manual work and DN; continue with eccentric strengthening; postural correction; taping and modalities as indicated    PT Home Exercise Plan [email or web address]    Consulted and Agree with Plan of Care Patient             Patient will benefit from skilled therapeutic intervention in order to improve the  following deficits and impairments:     Visit Diagnosis:   Lateral epicondylitis of right elbow  Other symptoms and signs involving the musculoskeletal system  Abnormal posture     Problem List Patient Active Problem List   Diagnosis Date Noted   Lateral epicondylitis, right elbow 03/22/2021   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   Chronic right shoulder pain 03/11/2021   Migraine without status migrainosus, not intractable 12/27/2020   Trouble in sleeping 01/07/2020   Painless rectal bleeding 11/12/2019   Breast lump 11/12/2019   Anxiety 05/19/2019   Pyelonephritis of right kidney 12/31/2018   Weight gain 02/15/2017   Appetite increase 02/15/2017   Chronic migraine 02/23/2015   Bad dreams 02/23/2015   HYPERCHOLESTEROLEMIA 04/24/2006   Major depressive disorder, recurrent episode ([facility]) 04/24/2006    Celyn P Holt, PT, MPH  06/08/2021, 12:43 PM  [facility] [address] [place], [place], [address] Phone: [phone]   Fax:  [phone]  Name: Cynthia [name] MRN: [id number] Date of Birth: 07/04/1971    

## 2021-06-13 ENCOUNTER — Other Ambulatory Visit: Payer: Self-pay

## 2021-06-13 ENCOUNTER — Encounter: Payer: Self-pay | Admitting: Gastroenterology

## 2021-06-13 ENCOUNTER — Ambulatory Visit (AMBULATORY_SURGERY_CENTER): Payer: Self-pay

## 2021-06-13 VITALS — Ht 66.0 in | Wt 180.0 lb

## 2021-06-13 DIAGNOSIS — Z1211 Encounter for screening for malignant neoplasm of colon: Secondary | ICD-10-CM

## 2021-06-13 MED ORDER — PLENVU 140 G PO SOLR: 1.0000 | Freq: Once | ORAL | 0 refills | Status: AC

## 2021-06-13 NOTE — Progress Notes (Signed)
Denies allergies to eggs or soy products. Denies complication of anesthesia or sedation. Denies use of weight loss medication. Denies use of O2.   Emmi instructions given for colonoscopy.   Patient was instructed to stop taking Phentermine 10 days prior to her procedure. Last dose will be 06/14/21. Patient verbalizes understanding.

## 2021-06-14 ENCOUNTER — Ambulatory Visit: Payer: BC Managed Care – PPO | Admitting: Sports Medicine

## 2021-06-17 ENCOUNTER — Encounter: Payer: BC Managed Care – PPO | Admitting: Rehabilitative and Restorative Service Providers"

## 2021-06-23 ENCOUNTER — Encounter: Payer: BC Managed Care – PPO | Admitting: Rehabilitative and Restorative Service Providers"

## 2021-06-24 ENCOUNTER — Encounter: Payer: Self-pay | Admitting: Gastroenterology

## 2021-06-24 ENCOUNTER — Ambulatory Visit (AMBULATORY_SURGERY_CENTER): Payer: BC Managed Care – PPO | Admitting: Gastroenterology

## 2021-06-24 VITALS — BP 90/41 | HR 70 | Temp 97.3°F | Resp 11 | Ht 66.0 in | Wt 180.0 lb

## 2021-06-24 DIAGNOSIS — Z1211 Encounter for screening for malignant neoplasm of colon: Secondary | ICD-10-CM | POA: Diagnosis present

## 2021-06-24 DIAGNOSIS — Z538 Procedure and treatment not carried out for other reasons: Secondary | ICD-10-CM

## 2021-06-24 MED ORDER — SODIUM CHLORIDE 0.9 % IV SOLN: 500.0000 mL | Freq: Once | INTRAVENOUS | Status: DC

## 2021-06-24 NOTE — Patient Instructions (Signed)
Thank you for allowing [place] to care for you today! Resume previous diet and medications today. Return to normal daily activities tomorrow. Our office will contact you regarding the virtual colonoscopy.  If you haven't heard from [place] within one week, leave message for Dr Doyne Keel nurse to call you back.      YOU HAD AN ENDOSCOPIC PROCEDURE TODAY AT [facility]:   Refer to the procedure report that was given to you for any specific questions about what was found during the examination.  If the procedure report does not answer your questions, please call your gastroenterologist to clarify.  If you requested that your care partner not be given the details of your procedure findings, then the procedure report has been included in a sealed envelope for you to review at your convenience later.  YOU SHOULD EXPECT: Some feelings of bloating in the abdomen. Passage of more gas than usual.  Walking can help get rid of the air that was put into your GI tract during the procedure and reduce the bloating. If you had a lower endoscopy (such as a colonoscopy or flexible sigmoidoscopy) you may notice spotting of blood in your stool or on the toilet paper. If you underwent a bowel prep for your procedure, you may not have a normal bowel movement for a few days.  Please Note:  You might notice some irritation and congestion in your nose or some drainage.  This is from the oxygen used during your procedure.  There is no need for concern and it should clear up in a day or so.  SYMPTOMS TO REPORT IMMEDIATELY:  Following lower endoscopy (colonoscopy or flexible sigmoidoscopy):  Excessive amounts of blood in the stool  Significant tenderness or worsening of abdominal pains  Swelling of the abdomen that is new, acute  Fever of 100F or higher    For urgent or emergent issues, a gastroenterologist can be reached at any hour by calling [phone]. Do not use MyChart messaging for urgent  concerns.    DIET:  We do recommend a small meal at first, but then you may proceed to your regular diet.  Drink plenty of fluids but you should avoid alcoholic beverages for 24 hours.  ACTIVITY:  You should plan to take it easy for the rest of today and you should NOT DRIVE or use heavy machinery until tomorrow (because of the sedation medicines used during the test).    FOLLOW UP: Our staff will call the number listed on your records 48-72 hours following your procedure to check on you and address any questions or concerns that you may have regarding the information given to you following your procedure. If we do not reach you, we will leave a message.  We will attempt to reach you two times.  During this call, we will ask if you have developed any symptoms of COVID 19. If you develop any symptoms (ie: fever, flu-like symptoms, shortness of breath, cough etc.) before then, please call [phone].  If you test positive for Covid 19 in the 2 weeks post procedure, please call and report this information to [place].    If any biopsies were taken you will be contacted by phone or by letter within the next 1-3 weeks.  Please call us at [phone] if you have not heard about the biopsies in 3 weeks.    SIGNATURES/CONFIDENTIALITY: You and/or your care partner have signed paperwork which will be entered into your electronic medical record.  These signatures attest to the fact that that the information above on your After Visit Summary has been reviewed and is understood.  Full responsibility of the confidentiality of this discharge information lies with you and/or your care-partner.

## 2021-06-24 NOTE — Progress Notes (Signed)
VSS transported to PACU

## 2021-06-24 NOTE — Progress Notes (Signed)
[facility] History and Physical   Primary Care Physician:  Lavada Mesi   Reason for Procedure:   Screening for colon cancer  Plan:    colonoscopy     HPI: Cynthia [name] is a 50 y.o. female  here for colonoscopy screening first time exam. Patient denies any bowel symptoms at this time. No family history of colon cancer known. Otherwise feels well without any cardiopulmonary symptoms.    Past Medical History:  Diagnosis Date   Anxiety    Depression    No pertinent past medical history     Past Surgical History:  Procedure Laterality Date   BREAST BIOPSY Right 07/16/2013   TONSILLECTOMY  1996   WISDOM TOOTH EXTRACTION  1990    Prior to Admission medications   Medication Sig Start Date End Date Taking? Authorizing Provider  buPROPion (WELLBUTRIN XL) 150 MG 24 hr tablet Take 1 tablet (150 mg total) by mouth daily. 12/27/20  Yes Breeback, Jade L, PA-C  FLUoxetine (PROZAC) 20 MG tablet Take 1 tablet (20 mg total) by mouth daily. 12/27/20  Yes Breeback, Jade L, PA-C  nitrofurantoin (MACRODANTIN) 50 MG capsule Take 1 capsule (50 mg total) by mouth daily. 12/27/20  Yes Breeback, Jade L, PA-C  nitroGLYCERIN (NITRODUR - DOSED IN MG/24 HR) 0.2 mg/hr patch Cut and apply 1/4 patch to most painful area q24h. 05/03/21   Silverio Decamp, MD  phentermine 30 MG capsule Take 30 mg by mouth every morning. Patient was instructed to hold phentermine beginning 06/14/21 until after her procedure.    [provider]  SUMAtriptan (IMITREX) 50 MG tablet Take 1 tablet (50 mg total) by mouth once for 1 dose. Can repeat in 2 hrs PRN 12/27/20 12/27/20  Donella Stade, PA-C    Current Outpatient Medications  Medication Sig Dispense Refill   buPROPion (WELLBUTRIN XL) 150 MG 24 hr tablet Take 1 tablet (150 mg total) by mouth daily. 90 tablet 3   FLUoxetine (PROZAC) 20 MG tablet Take 1 tablet (20 mg total) by mouth daily. 90 tablet 3   nitrofurantoin (MACRODANTIN) 50  MG capsule Take 1 capsule (50 mg total) by mouth daily. 90 capsule 3   nitroGLYCERIN (NITRODUR - DOSED IN MG/24 HR) 0.2 mg/hr patch Cut and apply 1/4 patch to most painful area q24h. 30 patch 11   phentermine 30 MG capsule Take 30 mg by mouth every morning. Patient was instructed to hold phentermine beginning 06/14/21 until after her procedure.     SUMAtriptan (IMITREX) 50 MG tablet Take 1 tablet (50 mg total) by mouth once for 1 dose. Can repeat in 2 hrs PRN 10 tablet 5   Current Facility-Administered Medications  Medication Dose Route Frequency Provider Last Rate Last Admin   0.9 %  sodium chloride infusion  500 mL Intravenous Once Neyla Gauntt, Carlota Raspberry, MD        Allergies as of 06/24/2021 - Review Complete 06/24/2021  Allergen Reaction Noted   Promethazine hcl     Trazodone and nefazodone  05/19/2019    Family History  Problem Relation Age of Onset   Parkinson's disease Mother    Diabetes Father    Skin cancer Father    Lupus Sister    Breast cancer Sister    Cancer Maternal Grandmother    Breast cancer Maternal Grandmother    Alcohol abuse Paternal Grandfather    Stomach cancer Neg Hx    Rectal cancer Neg Hx    Esophageal cancer Neg Hx  Colon cancer Neg Hx     Social History   Socioeconomic History   Marital status: Married    Spouse name: Not on file   Number of children: Not on file   Years of education: Not on file   Highest education level: Not on file  Occupational History   Not on file  Tobacco Use   Smoking status: Never   Smokeless tobacco: Never  Vaping Use   Vaping Use: Never used  Substance and Sexual Activity   Alcohol use: Yes    Comment: occasional beer   Drug use: No   Sexual activity: Yes  Other Topics Concern   Not on file  Social History Narrative   Not on file   Social Determinants of Health   Financial Resource Strain: Not on file  Food Insecurity: Not on file  Transportation Needs: Not on file  Physical Activity: Not on file   Stress: Not on file  Social Connections: Not on file  Intimate Partner Violence: Not on file    Review of Systems: All other review of systems negative except as mentioned in the HPI.  Physical Exam: Vital signs BP 102/76   Pulse 74   Temp (!) 97.3 F (36.3 C) (Temporal)   Ht 5' 6"  (1.676 m)   Wt 180 lb (81.6 kg)   SpO2 100%   BMI 29.05 kg/m   General:   Alert,  Well-developed, pleasant and cooperative in NAD Lungs:  Clear throughout to auscultation.   Heart:  Regular rate and rhythm Abdomen:  Soft, nontender and nondistended.   Neuro/Psych:  Alert and cooperative. Normal mood and affect. A and O x 3  Jolly Mango, MD [facility]

## 2021-06-24 NOTE — Progress Notes (Signed)
Pt's states no medical or surgical changes since previsit or office visit. 

## 2021-06-24 NOTE — Op Note (Signed)
[facility] Patient Name: Cynthia [name] Procedure Date: 06/24/2021 10:15 AM MRN: [id number] Endoscopist: Remo Lipps P. Havery Moros , MD Age: 50 Referring MD:  Date of Birth: [birth year] Gender: Female Account #: [id number] Procedure:                Colonoscopy Indications:              Screening for colorectal malignant neoplasm, This                            is the patient's first colonoscopy Medicines:                Monitored Anesthesia Care Procedure:                Pre-Anesthesia Assessment:                           - Prior to the procedure, a History and Physical                            was performed, and patient medications and                            allergies were reviewed. The patient's tolerance of                            previous anesthesia was also reviewed. The risks                            and benefits of the procedure and the sedation                            options and risks were discussed with the patient.                            All questions were answered, and informed consent                            was obtained. Prior Anticoagulants: The patient has                            taken no previous anticoagulant or antiplatelet                            agents. ASA Grade Assessment: II - A patient with                            mild systemic disease. After reviewing the risks                            and benefits, the patient was deemed in                            satisfactory condition to undergo the procedure.  After obtaining informed consent, the colonoscope                            was passed under direct vision. Throughout the                            procedure, the patient's blood pressure, pulse, and                            oxygen saturations were monitored continuously. The                            PCF-HQ190L Colonoscope was introduced through the                            anus with the  intention of advancing to the cecum.                            The scope was advanced to the hepatic flexure                            before the procedure was aborted. Medications were                            given. The colonoscopy was technically difficult                            and complex due to a redundant colon. The patient                            tolerated the procedure well. The quality of the                            bowel preparation was good. The rectum was                            photographed. Scope In: 10:30:07 AM Scope Out: 10:59:04 AM Total Procedure Duration: 0 hours 28 minutes 57 seconds  Findings:                 The perianal and digital rectal examinations were                            normal.                           The colon was long and redundant with looping in                            the more proximal colon. Despite use of 2                            colonoscopes - pediatric and adult, positional  changes, abdominal pressure, multiple attempts made                            but could not reduce the scope enough to advance                            beyond suspected hepatic flexure / distal ascending                            colon. Cecal intubation not achieved due to these                            issues.                           Internal hemorrhoids were found during                            retroflexion. The hemorrhoids were small.                           The exam was otherwise without abnormality. No                            polyps visualized. Complications:            No immediate complications. Estimated blood loss:                            None. Estimated Blood Loss:     Estimated blood loss: none. Impression:               - Long redundant colon - multiple attempts made as                            above but could not complete cecal intubation.                            Extent reached  suspected hepatic flexure / distal                            ascending colon.                           - Internal hemorrhoids.                           - The examination was otherwise normal.                           - No polyps Recommendation:           - Patient has a contact number available for                            emergencies. The signs and symptoms of potential  delayed complications were discussed with the                            patient. Return to normal activities tomorrow.                            Written discharge instructions were provided to the                            patient.                           - Resume previous diet.                           - Continue present medications.                           - Will discuss options with patient - consideration                            for virtual colonoscopy or stool based testing to                            complete her screening Cynthia Trimble P. Attikus Bartoszek, MD 06/24/2021 11:05:22 AM This report has been signed electronically.

## 2021-06-27 ENCOUNTER — Telehealth: Payer: Self-pay

## 2021-06-27 DIAGNOSIS — Z1211 Encounter for screening for malignant neoplasm of colon: Secondary | ICD-10-CM

## 2021-06-27 NOTE — Telephone Encounter (Signed)
-----   Message from Yetta Flock, MD sent at 06/24/2021  5:47 PM EST ----- Regarding: colon cancer screening test This patient had an incomplete colonoscopy with me unfortunately today due to her anatomy. Options at this time are stool based testing (cologuard or FIT) vs. Virtual colonoscopy. We discussed both. My preference would be virtual colonoscopy but not sure when this will be available. Can you please contact the patient this week and see which one she may want to do? Thanks

## 2021-06-27 NOTE — Telephone Encounter (Signed)
Okay thank you [name]

## 2021-06-27 NOTE — Telephone Encounter (Signed)
Called and spoke to patient. Explained the current situation with Virtual colonoscopy due to inability to get Magnesium Citrate and the uncertainty about how long it will be before we will be able to schedule those again.  Alternative stool testing was explained. She indicated she would prefer to do a Cologuard at this time. Order placed for Cologuard

## 2021-06-28 ENCOUNTER — Telehealth: Payer: Self-pay

## 2021-06-28 ENCOUNTER — Telehealth: Payer: Self-pay | Admitting: *Deleted

## 2021-06-28 NOTE — Telephone Encounter (Signed)
Telephone call to patient, VM obtained and message left that RN was calling to check on her after her procedure last week.  Informed patient RN will try to call back later today. SChaplin, RN,BSN

## 2021-06-28 NOTE — Telephone Encounter (Signed)
°  Follow up Call-  Call back number 06/24/2021  Post procedure Call Back phone  # [phone]  Permission to leave phone message Yes  Some recent data might be hidden     Patient questions:  Do you have a fever, pain , or abdominal swelling? No. Pain Score  0 *  Have you tolerated food without any problems? Yes.    Have you been able to return to your normal activities? Yes.    Do you have any questions about your discharge instructions: Diet   No. Medications  No. Follow up visit  No.  Do you have questions or concerns about your Care? No.  Actions: * If pain score is 4 or above: No action needed, pain <4.

## 2021-07-19 LAB — COLOGUARD: COLOGUARD: POSITIVE — AB

## 2021-07-19 NOTE — Telephone Encounter (Signed)
-----   Message from Roetta Sessions, CMA sent at 07/13/2021  9:38 AM EST ----- Regarding: FW: cologuard result?  ----- Message ----- From: Roetta Sessions, CMA Sent: 07/13/2021  12:00 AM EST To: Roetta Sessions, CMA Subject: cologuard result?                              Cologuard order sent on 12-12

## 2021-07-19 NOTE — Telephone Encounter (Signed)
MyChart message sent to patient to see if she has completed the Cologuard test which was ordered on 12-12

## 2021-07-22 ENCOUNTER — Encounter: Payer: Self-pay | Admitting: Gastroenterology

## 2021-08-10 ENCOUNTER — Encounter: Payer: Self-pay | Admitting: Physician Assistant

## 2021-08-10 ENCOUNTER — Other Ambulatory Visit: Payer: Self-pay

## 2021-08-10 ENCOUNTER — Ambulatory Visit: Payer: BC Managed Care – PPO | Admitting: Physician Assistant

## 2021-08-10 VITALS — BP 137/91 | HR 104 | Ht 66.0 in | Wt 188.8 lb

## 2021-08-10 DIAGNOSIS — N3001 Acute cystitis with hematuria: Secondary | ICD-10-CM | POA: Diagnosis not present

## 2021-08-10 DIAGNOSIS — Z1231 Encounter for screening mammogram for malignant neoplasm of breast: Secondary | ICD-10-CM | POA: Diagnosis not present

## 2021-08-10 LAB — POCT URINALYSIS DIP (CLINITEK)
Bilirubin, UA: NEGATIVE
Glucose, UA: NEGATIVE mg/dL
Ketones, POC UA: NEGATIVE mg/dL
Nitrite, UA: NEGATIVE
POC PROTEIN,UA: NEGATIVE
Spec Grav, UA: 1.015 (ref 1.010–1.025)
Urobilinogen, UA: 0.2 E.U./dL
pH, UA: 7 (ref 5.0–8.0)

## 2021-08-10 MED ORDER — PHENAZOPYRIDINE HCL 200 MG PO TABS: 200.0000 mg | ORAL_TABLET | Freq: Three times a day (TID) | ORAL | 0 refills | Status: AC

## 2021-08-10 MED ORDER — NITROFURANTOIN MONOHYD MACRO 100 MG PO CAPS: 100.0000 mg | ORAL_CAPSULE | Freq: Two times a day (BID) | ORAL | 0 refills | Status: DC

## 2021-08-10 NOTE — Patient Instructions (Signed)

## 2021-08-10 NOTE — Progress Notes (Signed)
Subjective:    Patient ID: Cynthia [name], female    DOB: 1971/04/06, 51 y.o.   MRN: [id number]  HPI Pt is a 51 yo female with UTI concerns. Pt has hx of recurrent UTIs and on daily 49m of macrobid. She has not had UTI in 4 years. Symptoms started 4 days ago. She did have a colonoscopy a week or so ago. She is having frequent urination with pain. No fever, chills, or flank pain. She is nauseated. No fever. Not tried anything to make better.   .. 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     Objective:   Physical Exam Vitals reviewed.  Constitutional:      Appearance: Normal appearance.  HENT:     Head: Normocephalic.  Cardiovascular:     Rate and Rhythm: Normal rate and regular rhythm.     Pulses: Normal pulses.     Heart sounds: Normal heart sounds.  Pulmonary:     Effort: Pulmonary effort is normal.     Breath sounds: Normal breath sounds.  Neurological:     General: No focal deficit present.     Mental Status: She is alert and oriented to person, place, and time.   Psychiatric:        Mood and Affect: Mood normal.    . Results for orders placed or performed in visit on 08/10/21  POCT URINALYSIS DIP (CLINITEK)  Result Value Ref Range   Color, UA yellow yellow   Clarity, UA cloudy (A) clear   Glucose, UA negative negative mg/dL   Bilirubin, UA negative negative   Ketones, POC UA negative negative mg/dL   Spec Grav, UA 1.015 1.010 - 1.025   Blood, UA moderate (A) negative   pH, UA 7.0 5.0 - 8.0   POC PROTEIN,UA negative negative, trace   Urobilinogen, UA 0.2 0.2 or 1.0 E.U./dL   Nitrite, UA Negative Negative   Leukocytes, UA Moderate (2+) (A) Negative          Assessment & Plan:  .Marland KitchenMarland KitchenKayliawas seen today for dysuria.  Diagnoses and all orders for this visit:  Acute cystitis with hematuria -     POCT URINALYSIS DIP (CLINITEK) -     Urine Culture -     phenazopyridine (PYRIDIUM) 200 MG tablet; Take 1 tablet (200 mg total) by mouth 3 (three) times daily for 2 days. -     nitrofurantoin, macrocrystal-monohydrate, (MACROBID) 100 MG capsule; Take 1 capsule (100 mg total) by mouth 2 (two) times daily.  Visit for screening mammogram -     MM 3D SCREEN BREAST BILATERAL  UA positive for blood, leuks.  Will culture. Will treat with macrobid and pyridium for 5 days.  HO given.  Follow up as needed or if symptoms worsen.

## 2021-08-13 ENCOUNTER — Encounter: Payer: Self-pay | Admitting: Physician Assistant

## 2021-08-13 LAB — URINE CULTURE
MICRO NUMBER:: 12923694
SPECIMEN QUALITY:: ADEQUATE

## 2021-08-15 ENCOUNTER — Other Ambulatory Visit: Payer: Self-pay | Admitting: Physician Assistant

## 2021-08-15 MED ORDER — SULFAMETHOXAZOLE-TRIMETHOPRIM 800-160 MG PO TABS: 1.0000 | ORAL_TABLET | Freq: Two times a day (BID) | ORAL | 0 refills | Status: DC

## 2021-08-15 NOTE — Progress Notes (Signed)
Stop macrobd not going to treat this bacteria. Sent bactrim for 7 days.

## 2021-08-17 ENCOUNTER — Other Ambulatory Visit: Payer: Self-pay

## 2021-08-17 ENCOUNTER — Ambulatory Visit (INDEPENDENT_AMBULATORY_CARE_PROVIDER_SITE_OTHER): Payer: BC Managed Care – PPO

## 2021-08-17 DIAGNOSIS — Z1231 Encounter for screening mammogram for malignant neoplasm of breast: Secondary | ICD-10-CM | POA: Diagnosis not present

## 2021-08-17 NOTE — Progress Notes (Signed)
Normal mammogram. Follow up in 1 year.

## 2021-08-18 ENCOUNTER — Other Ambulatory Visit: Payer: Self-pay

## 2021-08-18 ENCOUNTER — Ambulatory Visit: Payer: BC Managed Care – PPO | Admitting: *Deleted

## 2021-08-18 VITALS — Ht 66.0 in | Wt 185.0 lb

## 2021-08-18 DIAGNOSIS — R195 Other fecal abnormalities: Secondary | ICD-10-CM

## 2021-08-18 MED ORDER — PLENVU 140 G PO SOLR: 1.0000 | Freq: Once | ORAL | 0 refills | Status: AC

## 2021-08-18 NOTE — Progress Notes (Signed)
No egg or soy allergy known to patient  No issues known to pt with past sedation with any surgeries or procedures Patient denies ever being told they had issues or difficulty with intubation  No FH of Malignant Hyperthermia Pt is not on diet pills Pt is not on  home 02  Pt is not on blood thinners  Pt denies issues with constipation  No A fib or A flutter  Pt is fully vaccinated  for Covid   PLENVU Coupon to pt in PV today , Code to Pharmacy and  NO PA's for preps discussed with pt In PV today  Discussed with pt there will be an out-of-pocket cost for prep and that varies from $0 to 70 +  dollars - pt verbalized understanding   Due to the COVID-19 pandemic we are asking patients to follow certain guidelines in PV and the [facility]   Pt aware of COVID protocols and [facility] guidelines   PV completed over the phone. Pt verified name, DOB, address and insurance during PV today.  Pt mailed instruction packet with copy of consent form to read and not return, and instructions.  Pt encouraged to call with questions or issues.  If pt has My chart, procedure instructions sent via My Chart    Pt. Had already stop the phenterrnine,instructed not to start back until after completion of procedure,verbalize understanding.

## 2021-08-31 ENCOUNTER — Encounter: Payer: Self-pay | Admitting: Gastroenterology

## 2021-09-01 ENCOUNTER — Ambulatory Visit (AMBULATORY_SURGERY_CENTER): Payer: BC Managed Care – PPO | Admitting: Gastroenterology

## 2021-09-01 ENCOUNTER — Encounter: Payer: Self-pay | Admitting: Gastroenterology

## 2021-09-01 ENCOUNTER — Other Ambulatory Visit: Payer: Self-pay

## 2021-09-01 VITALS — BP 112/71 | HR 65 | Temp 98.0°F | Resp 8 | Ht 66.0 in | Wt 185.0 lb

## 2021-09-01 DIAGNOSIS — R195 Other fecal abnormalities: Secondary | ICD-10-CM

## 2021-09-01 DIAGNOSIS — K635 Polyp of colon: Secondary | ICD-10-CM | POA: Diagnosis not present

## 2021-09-01 DIAGNOSIS — D124 Benign neoplasm of descending colon: Secondary | ICD-10-CM

## 2021-09-01 DIAGNOSIS — D125 Benign neoplasm of sigmoid colon: Secondary | ICD-10-CM

## 2021-09-01 MED ORDER — DEXTROSE 5 % IV SOLN: INTRAVENOUS | Status: DC

## 2021-09-01 MED ORDER — SODIUM CHLORIDE 0.9 % IV SOLN: 500.0000 mL | Freq: Once | INTRAVENOUS | Status: DC

## 2021-09-01 NOTE — Progress Notes (Signed)
Pt's states no medical or surgical changes since previsit or office visit. 

## 2021-09-01 NOTE — Progress Notes (Signed)
Called to room to assist during endoscopic procedure.  Patient ID and intended procedure confirmed with present staff. Received instructions for my participation in the procedure from the performing physician.  

## 2021-09-01 NOTE — Progress Notes (Signed)
[facility] History and Physical   Primary Care Physician:  Lavada Mesi   Reason for Procedure:   Positive cologuard, history of incomplete colonoscopy  Plan:    colonoscopy     HPI: Cynthia [name] is a 51 y.o. female  here for colonoscopy - last attempted in December and incomplete exam due to long / redundant colon with looping . We decided to proceed with a cologuard which was positive, now here for follow up colonoscopy. Patient denies any bowel symptoms at this time. No family history of colon cancer known. Otherwise feels well without any cardiopulmonary symptoms.    Past Medical History:  Diagnosis Date   Anxiety    Depression    Hyperlipidemia    No pertinent past medical history     Past Surgical History:  Procedure Laterality Date   BREAST BIOPSY Right 07/16/2013   acute and chronic inflammatory changes and ectatic ducts   TONSILLECTOMY  1996   WISDOM TOOTH EXTRACTION  1990    Prior to Admission medications   Medication Sig Start Date End Date Taking? Authorizing Provider  buPROPion (WELLBUTRIN XL) 150 MG 24 hr tablet Take 1 tablet (150 mg total) by mouth daily. 12/27/20  Yes Breeback, Jade L, PA-C  FLUoxetine (PROZAC) 20 MG tablet Take 1 tablet (20 mg total) by mouth daily. 12/27/20  Yes Breeback, Jade L, PA-C  nitrofurantoin (MACRODANTIN) 50 MG capsule Take 1 capsule (50 mg total) by mouth daily. 12/27/20  Yes Breeback, Jade L, PA-C  metFORMIN (GLUCOPHAGE) 500 MG tablet Take 500 mg by mouth daily. WITH DINNER    [provider]  nitrofurantoin, macrocrystal-monohydrate, (MACROBID) 100 MG capsule Take 1 capsule (100 mg total) by mouth 2 (two) times daily. Patient not taking: Reported on 08/18/2021 08/10/21   Donella Stade, PA-C  nitroGLYCERIN (NITRODUR - DOSED IN MG/24 HR) 0.2 mg/hr patch Cut and apply 1/4 patch to most painful area q24h. Patient not taking: Reported on 08/18/2021 05/03/21   Silverio Decamp, MD  phentermine  (ADIPEX-P) 37.5 MG tablet 1 tab po 30 min before breakfast daily Patient not taking: Reported on 08/18/2021 08/18/21   [provider]  sulfamethoxazole-trimethoprim (BACTRIM DS) 800-160 MG tablet Take 1 tablet by mouth 2 (two) times daily. For 7 days. Patient not taking: Reported on 09/01/2021 08/15/21   Donella Stade, PA-C  SUMAtriptan (IMITREX) 50 MG tablet Take 1 tablet (50 mg total) by mouth once for 1 dose. Can repeat in 2 hrs PRN 12/27/20 12/27/20  Iran Planas L, PA-C  topiramate (TOPAMAX) 50 MG tablet Take 50 mg by mouth as needed. Patient not taking: Reported on 08/18/2021 08/04/21   [provider]    Current Outpatient Medications  Medication Sig Dispense Refill   buPROPion (WELLBUTRIN XL) 150 MG 24 hr tablet Take 1 tablet (150 mg total) by mouth daily. 90 tablet 3   FLUoxetine (PROZAC) 20 MG tablet Take 1 tablet (20 mg total) by mouth daily. 90 tablet 3   nitrofurantoin (MACRODANTIN) 50 MG capsule Take 1 capsule (50 mg total) by mouth daily. 90 capsule 3   metFORMIN (GLUCOPHAGE) 500 MG tablet Take 500 mg by mouth daily. WITH DINNER     nitrofurantoin, macrocrystal-monohydrate, (MACROBID) 100 MG capsule Take 1 capsule (100 mg total) by mouth 2 (two) times daily. (Patient not taking: Reported on 08/18/2021) 10 capsule 0   nitroGLYCERIN (NITRODUR - DOSED IN MG/24 HR) 0.2 mg/hr patch Cut and apply 1/4 patch to most painful area q24h. (  Patient not taking: Reported on 08/18/2021) 30 patch 11   phentermine (ADIPEX-P) 37.5 MG tablet 1 tab po 30 min before breakfast daily (Patient not taking: Reported on 08/18/2021)     sulfamethoxazole-trimethoprim (BACTRIM DS) 800-160 MG tablet Take 1 tablet by mouth 2 (two) times daily. For 7 days. (Patient not taking: Reported on 09/01/2021) 14 tablet 0   SUMAtriptan (IMITREX) 50 MG tablet Take 1 tablet (50 mg total) by mouth once for 1 dose. Can repeat in 2 hrs PRN 10 tablet 5   topiramate (TOPAMAX) 50 MG tablet Take 50 mg by mouth as needed.  (Patient not taking: Reported on 08/18/2021)     Current Facility-Administered Medications  Medication Dose Route Frequency Provider Last Rate Last Admin   0.9 %  sodium chloride infusion  500 mL Intravenous Once Ifeoluwa Beller, Carlota Raspberry, MD       dextrose 5 % solution   Intravenous Continuous Maddox Bratcher, Carlota Raspberry, MD        Allergies as of 09/01/2021 - Review Complete 09/01/2021  Allergen Reaction Noted   Promethazine hcl     Trazodone and nefazodone  05/19/2019    Family History  Problem Relation Age of Onset   Parkinson's disease Mother    Colon polyps Father    Diabetes Father    Skin cancer Father    Lupus Sister    Breast cancer Sister    Cancer Maternal Grandmother    Breast cancer Maternal Grandmother    Alcohol abuse Paternal Grandfather    Stomach cancer Neg Hx    Rectal cancer Neg Hx    Esophageal cancer Neg Hx    Colon cancer Neg Hx     Social History   Socioeconomic History   Marital status: Married    Spouse name: Not on file   Number of children: Not on file   Years of education: Not on file   Highest education level: Not on file  Occupational History   Not on file  Tobacco Use   Smoking status: Never   Smokeless tobacco: Never  Vaping Use   Vaping Use: Never used  Substance and Sexual Activity   Alcohol use: Yes    Comment: occasional beer   Drug use: No   Sexual activity: Yes  Other Topics Concern   Not on file  Social History Narrative   Not on file   Social Determinants of Health   Financial Resource Strain: Not on file  Food Insecurity: Not on file  Transportation Needs: Not on file  Physical Activity: Not on file  Stress: Not on file  Social Connections: Not on file  Intimate Partner Violence: Not on file    Review of Systems: All other review of systems negative except as mentioned in the HPI.  Physical Exam: Vital signs BP (!) 132/56    Pulse 79    Temp 98 F (36.7 C)    Ht 5' 6"  (1.676 m)    Wt 185 lb (83.9 kg)    SpO2 100%     BMI 29.86 kg/m   General:   Alert,  Well-developed, pleasant and cooperative in NAD Lungs:  Clear throughout to auscultation.   Heart:  Regular rate and rhythm Abdomen:  Soft, nontender and nondistended.   Neuro/Psych:  Alert and cooperative. Normal mood and affect. A and O x 3  Jolly Mango, MD [facility]

## 2021-09-01 NOTE — Op Note (Signed)
[facility] Patient Name: Cynthia [name] Procedure Date: 09/01/2021 2:29 PM MRN: [id number] Endoscopist: Remo Lipps P. Havery Moros , MD Age: 51 Referring MD:  Date of Birth: Sep 26, 1970 Gender: Female Account #: [id number] Procedure:                Colonoscopy Indications:              Positive Cologuard test - incomplete colonoscopy                            with failure to reach cecum on colonoscopy in                            December due to long / redundant colon. Abdominal                            binder used for this case. Medicines:                Monitored Anesthesia Care Procedure:                Pre-Anesthesia Assessment:                           - Prior to the procedure, a History and Physical                            was performed, and patient medications and                            allergies were reviewed. The patient's tolerance of                            previous anesthesia was also reviewed. The risks                            and benefits of the procedure and the sedation                            options and risks were discussed with the patient.                            All questions were answered, and informed consent                            was obtained. Prior Anticoagulants: The patient has                            taken no previous anticoagulant or antiplatelet                            agents. ASA Grade Assessment: II - A patient with                            mild systemic disease. After reviewing the risks  and benefits, the patient was deemed in                            satisfactory condition to undergo the procedure.                           After obtaining informed consent, the colonoscope                            was passed under direct vision. Throughout the                            procedure, the patient's blood pressure, pulse, and                            oxygen saturations were  monitored continuously. The                            PCF-HQ190L Colonoscope was introduced through the                            anus and advanced to the the terminal ileum, with                            identification of the appendiceal orifice and IC                            valve. The colonoscopy was technically difficult                            and complex due to a redundant colon. The patient                            tolerated the procedure well. The quality of the                            bowel preparation was good. The terminal ileum,                            ileocecal valve, appendiceal orifice, and rectum                            were photographed. Scope In: 2:47:29 PM Scope Out: 3:11:48 PM Scope Withdrawal Time: 0 hours 11 minutes 58 seconds  Total Procedure Duration: 0 hours 24 minutes 19 seconds  Findings:                 The perianal and digital rectal examinations were                            normal.                           The terminal ileum appeared normal.  The colon was long / redundant with significant                            looping. Cecal intubation was challenging but                            abdominal binder helped and abdominal pressure                            utilized                           A 3 mm polyp was found in the descending colon. The                            polyp was sessile. The polyp was removed with a                            cold snare. Resection and retrieval were complete.                           A 3 mm polyp was found in the sigmoid colon. The                            polyp was sessile. The polyp was removed with a                            cold snare. Resection and retrieval were complete.                           The exam was otherwise without abnormality. Complications:            No immediate complications. Estimated blood loss:                            Minimal. Estimated  Blood Loss:     Estimated blood loss was minimal. Impression:               - The examined portion of the ileum was normal.                           - Redundant / long colon with looping.                           - One 3 mm polyp in the descending colon, removed                            with a cold snare. Resected and retrieved.                           - One 3 mm polyp in the sigmoid colon, removed with                            a cold  snare. Resected and retrieved.                           - The examination was otherwise normal. Recommendation:           - Patient has a contact number available for                            emergencies. The signs and symptoms of potential                            delayed complications were discussed with the                            patient. Return to normal activities tomorrow.                            Written discharge instructions were provided to the                            patient.                           - Resume previous diet.                           - Continue present medications.                           - Await pathology results. Remo Lipps P. Carma Dwiggins, MD 09/01/2021 3:17:08 PM This report has been signed electronically.

## 2021-09-01 NOTE — Progress Notes (Signed)
Vss nad trans to pacu

## 2021-09-01 NOTE — Patient Instructions (Signed)
Resume previous diet and medications. Awaiting pathology results. Repeat Colonoscopy date to be determined based on pathology results.  YOU HAD AN ENDOSCOPIC PROCEDURE TODAY AT [facility]:   Refer to the procedure report that was given to you for any specific questions about what was found during the examination.  If the procedure report does not answer your questions, please call your gastroenterologist to clarify.  If you requested that your care partner not be given the details of your procedure findings, then the procedure report has been included in a sealed envelope for you to review at your convenience later.  YOU SHOULD EXPECT: Some feelings of bloating in the abdomen. Passage of more gas than usual.  Walking can help get rid of the air that was put into your GI tract during the procedure and reduce the bloating. If you had a lower endoscopy (such as a colonoscopy or flexible sigmoidoscopy) you may notice spotting of blood in your stool or on the toilet paper. If you underwent a bowel prep for your procedure, you may not have a normal bowel movement for a few days.  Please Note:  You might notice some irritation and congestion in your nose or some drainage.  This is from the oxygen used during your procedure.  There is no need for concern and it should clear up in a day or so.  SYMPTOMS TO REPORT IMMEDIATELY:  Following lower endoscopy (colonoscopy or flexible sigmoidoscopy):  Excessive amounts of blood in the stool  Significant tenderness or worsening of abdominal pains  Swelling of the abdomen that is new, acute  Fever of 100F or higher  For urgent or emergent issues, a gastroenterologist can be reached at any hour by calling [phone]. Do not use MyChart messaging for urgent concerns.    DIET:  We do recommend a small meal at first, but then you may proceed to your regular diet.  Drink plenty of fluids but you should avoid alcoholic beverages for 24  hours.  ACTIVITY:  You should plan to take it easy for the rest of today and you should NOT DRIVE or use heavy machinery until tomorrow (because of the sedation medicines used during the test).    FOLLOW UP: Our staff will call the number listed on your records 48-72 hours following your procedure to check on you and address any questions or concerns that you may have regarding the information given to you following your procedure. If we do not reach you, we will leave a message.  We will attempt to reach you two times.  During this call, we will ask if you have developed any symptoms of COVID 19. If you develop any symptoms (ie: fever, flu-like symptoms, shortness of breath, cough etc.) before then, please call [phone].  If you test positive for Covid 19 in the 2 weeks post procedure, please call and report this information to [place].    If any biopsies were taken you will be contacted by phone or by letter within the next 1-3 weeks.  Please call us at [phone] if you have not heard about the biopsies in 3 weeks.    SIGNATURES/CONFIDENTIALITY: You and/or your care partner have signed paperwork which will be entered into your electronic medical record.  These signatures attest to the fact that that the information above on your After Visit Summary has been reviewed and is understood.  Full responsibility of the confidentiality of this discharge information lies with you and/or your care-partner.

## 2021-09-01 NOTE — Progress Notes (Signed)
D.T. vital signs.

## 2021-09-06 ENCOUNTER — Telehealth: Payer: Self-pay

## 2021-09-06 NOTE — Telephone Encounter (Signed)
°  Follow up Call-  Call back number 09/01/2021 06/24/2021  Post procedure Call Back phone  # [phone]  Permission to leave phone message Yes Yes  Some recent data might be hidden     Patient questions:  Do you have a fever, pain , or abdominal swelling? No. Pain Score  0 *  Have you tolerated food without any problems? Yes.    Have you been able to return to your normal activities? Yes.    Do you have any questions about your discharge instructions: Diet   No. Medications  No. Follow up visit  No.  Do you have questions or concerns about your Care? No.  Actions: * If pain score is 4 or above: No action needed, pain <4.

## 2021-10-15 ENCOUNTER — Other Ambulatory Visit: Payer: Self-pay | Admitting: Physician Assistant

## 2021-10-15 DIAGNOSIS — F419 Anxiety disorder, unspecified: Secondary | ICD-10-CM

## 2021-10-15 DIAGNOSIS — F331 Major depressive disorder, recurrent, moderate: Secondary | ICD-10-CM

## 2022-01-31 DIAGNOSIS — N951 Menopausal and female climacteric states: Secondary | ICD-10-CM | POA: Insufficient documentation

## 2022-02-03 LAB — HM PAP SMEAR

## 2022-02-10 ENCOUNTER — Other Ambulatory Visit: Payer: Self-pay | Admitting: Physician Assistant

## 2022-02-10 DIAGNOSIS — N390 Urinary tract infection, site not specified: Secondary | ICD-10-CM

## 2022-02-15 ENCOUNTER — Encounter: Payer: Self-pay | Admitting: Neurology

## 2022-03-12 ENCOUNTER — Other Ambulatory Visit: Payer: Self-pay | Admitting: Physician Assistant

## 2022-03-12 DIAGNOSIS — F419 Anxiety disorder, unspecified: Secondary | ICD-10-CM

## 2022-03-12 DIAGNOSIS — F331 Major depressive disorder, recurrent, moderate: Secondary | ICD-10-CM

## 2022-04-05 ENCOUNTER — Telehealth: Payer: Self-pay | Admitting: General Practice

## 2022-04-05 NOTE — Telephone Encounter (Signed)
Transition Care Management Follow-up Telephone Call Date of discharge and from where: 04/04/22 from [facility] How have you been since you were released from the hospital? Still in pain. Ankle is pretty swollen and is in a boot. Has seen Dr. Darene Lamer before.  Any questions or concerns? No  Items Reviewed: Did the pt receive and understand the discharge instructions provided? Yes  Medications obtained and verified? Yes  Other? No  Any new allergies since your discharge? No  Dietary orders reviewed? Yes Do you have support at home? Yes   Home Care and Equipment/Supplies: Were home health services ordered? no  Functional Questionnaire: (I = Independent and D = Dependent) ADLs: I  Bathing/Dressing- I  Meal Prep- I  Eating- I  Maintaining continence- I  Transferring/Ambulation- I  Managing Meds- I  Follow up appointments reviewed:  PCP Hospital f/u appt confirmed? Yes  Scheduled to see Dr. Darene Lamer on 04/10/22 @ 400pm. [facility] f/u appt confirmed? No   Are transportation arrangements needed? No  If their condition worsens, is the pt aware to call PCP or go to the Emergency Dept.? Yes Was the patient provided with contact information for the PCP's office or ED? Yes Was to pt encouraged to call back with questions or concerns? Yes

## 2022-04-10 ENCOUNTER — Ambulatory Visit: Payer: BC Managed Care – PPO | Admitting: Sports Medicine

## 2022-04-10 DIAGNOSIS — S99921A Unspecified injury of right foot, initial encounter: Secondary | ICD-10-CM | POA: Diagnosis not present

## 2022-04-10 MED ORDER — HYDROCODONE-ACETAMINOPHEN 5-325 MG PO TABS: 1.0000 | ORAL_TABLET | Freq: Three times a day (TID) | ORAL | 0 refills | Status: DC | PRN

## 2022-04-10 NOTE — Progress Notes (Signed)
    Procedures performed today:    None.  Independent interpretation of notes and tests performed by another provider:   None.  Brief History, Exam, Impression, and Recommendations:    Right foot injury This is a pleasant 51 year old female, approximately week ago she took a misstep and inverted her right ankle, she had immediate pain, swelling, bruising and was unable to bear weight, she was seen in the emergency department where x-rays were negative, placed in a boot and referred to me, today she still has significant swelling, she has a lot of tenderness over the dorsal foot, as well as over the fibula. I do suspect that we are missing an occult fracture here, I would like a CT of the foot, she will continue ibuprofen '800mg'$  3 times daily and I am going to add hydrocodone for breakthrough pain. Continue to elevate and ice. She does have a trip coming up to [place] in the middle of next month, so I suspect we will be placing a cast and removing the cast prior to her trip for transition back into the boot.    ____________________________________________ Gwen Her. Dianah Field, M.D., ABFM., CAQSM., AME. Primary Care and Sports Medicine [facility]  Adjunct Professor of [place] of [facility] of Medicine  Risk manager

## 2022-04-10 NOTE — Assessment & Plan Note (Signed)
This is a pleasant 51 year old female, approximately week ago she took a misstep and inverted her right ankle, she had immediate pain, swelling, bruising and was unable to bear weight, she was seen in the emergency department where x-rays were negative, placed in a boot and referred to me, today she still has significant swelling, she has a lot of tenderness over the dorsal foot, as well as over the fibula. I do suspect that we are missing an occult fracture here, I would like a CT of the foot, she will continue ibuprofen '800mg'$  3 times daily and I am going to add hydrocodone for breakthrough pain. Continue to elevate and ice. She does have a trip coming up to [place] in the middle of next month, so I suspect we will be placing a cast and removing the cast prior to her trip for transition back into the boot.

## 2022-04-11 ENCOUNTER — Ambulatory Visit (INDEPENDENT_AMBULATORY_CARE_PROVIDER_SITE_OTHER): Payer: BC Managed Care – PPO

## 2022-04-11 DIAGNOSIS — S99921A Unspecified injury of right foot, initial encounter: Secondary | ICD-10-CM

## 2022-04-14 ENCOUNTER — Telehealth: Payer: Self-pay

## 2022-04-14 ENCOUNTER — Ambulatory Visit (INDEPENDENT_AMBULATORY_CARE_PROVIDER_SITE_OTHER): Payer: BC Managed Care – PPO | Admitting: Sports Medicine

## 2022-04-14 DIAGNOSIS — S99921D Unspecified injury of right foot, subsequent encounter: Secondary | ICD-10-CM | POA: Diagnosis not present

## 2022-04-14 NOTE — Progress Notes (Addendum)
    Procedures performed today:    Short leg walking cast placed today.  Independent interpretation of notes and tests performed by another provider:   CT personally reviewed, I do see the medial talar dome fracture.  It is small and more of an avulsion.  Brief History, Exam, Impression, and Recommendations:    Right foot injury Cynthia [name] returns, she is a pleasant 51 year old female, she had an inversion injury about a week ago, severe pain, swelling, we suspected an occult fracture so obtained a CT that did show a medial talar dome fracture, she also had medial and lateral ligamentous injuries. We applied a short leg walking cast today, and we wrote a letter so that she does not have to travel to [place] secondary to her injury. Return see me in a month.     ____________________________________________ Gwen Her. Dianah Field, M.D., ABFM., CAQSM., AME. Primary Care and Sports Medicine [facility]  Adjunct Professor of [place] of [facility] of Medicine  Risk manager

## 2022-04-14 NOTE — Assessment & Plan Note (Addendum)
Cynthia [name] returns, she is a pleasant 51 year old female, she had an inversion injury about a week ago, severe pain, swelling, we suspected an occult fracture so obtained a CT that did show a medial talar dome fracture, she also had medial and lateral ligamentous injuries. We applied a short leg walking cast today, and we wrote a letter so that she does not have to travel to [place] secondary to her injury. Return see me in a month.

## 2022-04-14 NOTE — Telephone Encounter (Signed)
error 

## 2022-04-20 DIAGNOSIS — K649 Unspecified hemorrhoids: Secondary | ICD-10-CM | POA: Insufficient documentation

## 2022-04-28 ENCOUNTER — Ambulatory Visit: Payer: BC Managed Care – PPO | Admitting: Sports Medicine

## 2022-05-26 ENCOUNTER — Ambulatory Visit: Payer: BC Managed Care – PPO | Admitting: Sports Medicine

## 2022-05-26 DIAGNOSIS — S99921D Unspecified injury of right foot, subsequent encounter: Secondary | ICD-10-CM

## 2022-05-26 NOTE — Progress Notes (Signed)
    Procedures performed today:    None.  Independent interpretation of notes and tests performed by another provider:   None.  Brief History, Exam, Impression, and Recommendations:    Right foot injury Aayana with returns, she is a very pleasant 51 year old female, had an inversion injury approximately 8 weeks ago, severe pain and swelling, x-rays negative so we obtained a CT that showed a medial talar dome fracture and lateral ligamentous injury. She has been in a cast, short leg for 6 weeks now. Cast is removed, only minimal tenderness medial talar dome, lateral structures are intact, good motion, good strength. Transition into an ASO. Home physical therapy given, return to see me in 4 weeks.    ____________________________________________ Gwen Her. Dianah Field, M.D., ABFM., CAQSM., AME. Primary Care and Sports Medicine [facility]  Adjunct Professor of [place] of [facility] of Medicine  Risk manager

## 2022-05-26 NOTE — Assessment & Plan Note (Signed)
Cynthia [name] with returns, she is a very pleasant 51 year old female, had an inversion injury approximately 8 weeks ago, severe pain and swelling, x-rays negative so we obtained a CT that showed a medial talar dome fracture and lateral ligamentous injury. She has been in a cast, short leg for 6 weeks now. Cast is removed, only minimal tenderness medial talar dome, lateral structures are intact, good motion, good strength. Transition into an ASO. Home physical therapy given, return to see me in 4 weeks.

## 2022-06-01 ENCOUNTER — Encounter: Payer: Self-pay | Admitting: Physician Assistant

## 2022-06-02 MED ORDER — FLUOXETINE HCL 20 MG PO CAPS: 20.0000 mg | ORAL_CAPSULE | Freq: Every day | ORAL | 0 refills | Status: DC

## 2022-06-07 ENCOUNTER — Telehealth: Payer: Self-pay

## 2022-06-07 NOTE — Telephone Encounter (Signed)
Initiated Prior authorization IFB:[id number] HCl '20MG'$  tablets Via: Covermymeds Case/Key:[id number]  Status: approved as of 06/07/22 Reason:;Coverage Start Date:02/28/2022;Coverage End Date:03/30/2023; Notified Pt via: Mychart

## 2022-06-27 ENCOUNTER — Other Ambulatory Visit: Payer: Self-pay

## 2022-06-27 ENCOUNTER — Encounter: Payer: Self-pay | Admitting: Physical Therapy

## 2022-06-27 ENCOUNTER — Ambulatory Visit: Payer: BC Managed Care – PPO | Attending: Sports Medicine | Admitting: Physical Therapy

## 2022-06-27 ENCOUNTER — Other Ambulatory Visit: Payer: Self-pay | Admitting: Physician Assistant

## 2022-06-27 ENCOUNTER — Ambulatory Visit (INDEPENDENT_AMBULATORY_CARE_PROVIDER_SITE_OTHER): Payer: BC Managed Care – PPO | Admitting: Sports Medicine

## 2022-06-27 DIAGNOSIS — R262 Difficulty in walking, not elsewhere classified: Secondary | ICD-10-CM | POA: Diagnosis present

## 2022-06-27 DIAGNOSIS — S99921D Unspecified injury of right foot, subsequent encounter: Secondary | ICD-10-CM | POA: Insufficient documentation

## 2022-06-27 DIAGNOSIS — M25571 Pain in right ankle and joints of right foot: Secondary | ICD-10-CM | POA: Diagnosis present

## 2022-06-27 NOTE — Assessment & Plan Note (Signed)
This is a very pleasant 51 year old female, she had an inversion injury approximately 12 weeks ago, she had severe pain, swelling, x-rays were negative so we obtained a CT that showed a medial talar dome fracture and a lateral ligamentous injury, we placed a short leg cast, she was in the cast for about 6 weeks, cast was removed at the last visit, she is doing well, no pain, but does have significant stiffness. She does have some fullness which I explained will probably stay for several months, adding physical therapy, return to see me in 6 weeks but only if needed.

## 2022-06-27 NOTE — Progress Notes (Signed)
    Procedures performed today:    None.  Independent interpretation of notes and tests performed by another provider:   None.  Brief History, Exam, Impression, and Recommendations:    Right foot injury, medial talar dome fracture, lateral ligamentous injury This is a very pleasant 51 year old female, she had an inversion injury approximately 12 weeks ago, she had severe pain, swelling, x-rays were negative so we obtained a CT that showed a medial talar dome fracture and a lateral ligamentous injury, we placed a short leg cast, she was in the cast for about 6 weeks, cast was removed at the last visit, she is doing well, no pain, but does have significant stiffness. She does have some fullness which I explained will probably stay for several months, adding physical therapy, return to see me in 6 weeks but only if needed.    ____________________________________________ Gwen Her. Dianah Field, M.D., ABFM., CAQSM., AME. Primary Care and Sports Medicine [facility] [facility]  Adjunct Professor of [place] of [facility] of Medicine  Risk manager

## 2022-06-27 NOTE — Therapy (Signed)
OUTPATIENT PHYSICAL THERAPY LOWER EXTREMITY EVALUATION   Patient Name: Cynthia [name] MRN: [id number] DOB:September 08, 1970, 51 y.o., female Today's Date: 06/27/2022  END OF SESSION:  PT End of Session - 06/27/22 1615     Visit Number 1    Number of Visits 6    Date for PT Re-Evaluation 08/08/22    PT Start Time 6203    PT Stop Time 1615    PT Time Calculation (min) 45 min    Activity Tolerance Patient tolerated treatment well    Behavior During Therapy [facility] for tasks assessed/performed             Past Medical History:  Diagnosis Date   Anxiety    Depression    Hyperlipidemia    No pertinent past medical history    Past Surgical History:  Procedure Laterality Date   BREAST BIOPSY Right 07/16/2013   acute and chronic inflammatory changes and ectatic ducts   TONSILLECTOMY  1996   [place]   Patient Active Problem List   Diagnosis Date Noted   Right foot injury, medial talar dome fracture, lateral ligamentous injury 04/10/2022   Lateral epicondylitis, right elbow 03/22/2021   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   Chronic right shoulder pain 03/11/2021   Migraine without status migrainosus, not intractable 12/27/2020   Trouble in sleeping 01/07/2020   Painless rectal bleeding 11/12/2019   Breast lump 11/12/2019   Anxiety 05/19/2019   Pyelonephritis of right kidney 12/31/2018   Weight gain 02/15/2017   Appetite increase 02/15/2017   Chronic migraine 02/23/2015   Bad dreams 02/23/2015   HYPERCHOLESTEROLEMIA 04/24/2006   Major depressive disorder, recurrent episode ([place]) 04/24/2006    PCP: Iran Planas  REFERRING PROVIDER: Roma Schanz DIAG: Injury of Rt foot  THERAPY DIAG:  Pain in right ankle and joints of right foot  Difficulty in walking, not elsewhere classified  Rationale for Evaluation and Treatment: Rehabilitation  ONSET DATE: 03/2022  SUBJECTIVE:   SUBJECTIVE STATEMENT: Pt broke her Rt ankle at the end of  September and did not get a cast until 2 weeks later. She was in a cast for 6 weeks and then wore a brace for 2 weeks. She went to MD because she continues to feel "stiff".  She has difficulty with descending stairs. She has some "pulling" pain when she "steps weird". Pain eases with elevation. Pt states she is not using ice or meds any longer  PERTINENT HISTORY: Pt has fractured Rt foot 3 times PAIN:  Are you having pain?  Pt with no c/o pain sitting at rest. She states she only has pain if she "steps weird".  PRECAUTIONS: None  WEIGHT BEARING RESTRICTIONS: No  FALLS:  Has patient fallen in last 6 months? Yes. Number of falls 1  OCCUPATION: desk job  PLOF: Independent  PATIENT GOALS: improve mobility of ankle  NEXT MD VISIT:   OBJECTIVE:   EDEMA:  Circumferential: 26cm Rt, 23.5cm Lt  PALPATION: No TTP Hypomobile with talocrural mobs  LOWER EXTREMITY ROM:  Active ROM Right eval Left eval  Hip flexion    Hip extension    Hip abduction    Hip adduction    Hip internal rotation    Hip external rotation    Knee flexion    Knee extension    Ankle dorsiflexion 6 11  Ankle plantarflexion 54 - pain 71  Ankle inversion 41 50  Ankle eversion 18 22   (Blank rows = not tested)  LOWER EXTREMITY  MMT:  MMT Right eval Left eval  Hip flexion    Hip extension    Hip abduction    Hip adduction    Hip internal rotation    Hip external rotation    Knee flexion    Knee extension    Ankle dorsiflexion 4 4+  Ankle plantarflexion 4- 4+  Ankle inversion 4 4+  Ankle eversion 4 4+   (Blank rows = not tested)  FUNCTIONAL TESTS:  SLS Rt 13 sec, Lt 30 sec  FOTO: 64 (eval)  TODAY'S TREATMENT:                                                                                                                              DATE: 06/27/22 See HEP    PATIENT EDUCATION:  Education details: PT POC and goals, HEP Person educated: Patient Education method: Explanation,  Demonstration, and handouts Education comprehension: verbalized understanding and returned demonstration  HOME EXERCISE PROGRAM: Access Code: [email or web address] URL: [email or web address] Date: 06/27/2022 Prepared by: Isabelle Course  Exercises - Standing Heel Raise  - 1 x daily - 7 x weekly - 3 sets - 10 reps - Single Leg Stance  - 1 x daily - 7 x weekly - 1 sets - 3 reps - 20-30 seconds hold - Soleus Stretch on Wall  - 1 x daily - 7 x weekly - 1 sets - 3 reps - 20-30 seconds hold - Seated Anterior Tibialis Stretch  - 1 x daily - 7 x weekly - 1 sets - 3 reps - 20-30 seconds hold - Seated Ankle Plantarflexion Dorsiflexion PROM  - 1 x daily - 7 x weekly - 1 sets - 3 reps - 20-30 seconds hold - Towel Scrunches  - 1 x daily - 7 x weekly - 3 sets - 10 reps - Toe Yoga - Alternating Great Toe and Lesser Toe Extension  - 1 x daily - 7 x weekly - 3 sets - 10 reps  ASSESSMENT:  CLINICAL IMPRESSION: Patient is a 51 y.o. female who was seen today for physical therapy evaluation and treatment for injury of Rt foot. Pt presents with decreased strength, ROM and balance, increased pain and decreased functional activity tolerance. Pt will benefit from skilled PT to address deficits and improve functional mobility and decrease pain.   OBJECTIVE IMPAIRMENTS: decreased activity tolerance, difficulty walking, decreased ROM, decreased strength, increased edema, and pain.   ACTIVITY LIMITATIONS: locomotion level  PARTICIPATION LIMITATIONS: shopping and community activity  PERSONAL FACTORS: Past/current experiences and Time since onset of injury/illness/exacerbation are also affecting patient's functional outcome.   REHAB POTENTIAL: Good  CLINICAL DECISION MAKING: Stable/uncomplicated  EVALUATION COMPLEXITY: Low   GOALS: Goals reviewed with patient? Yes  SHORT TERM GOALS: Target date: 07/11/2022   Pt will be independent with initial HEP Baseline: Goal status: INITIAL   LONG TERM GOALS:  Target date: 08/08/2022    Pt will be independent with advanced HEP Baseline:  Goal status: INITIAL  2.  Pt will tolerate walking x 30 minutes with pain <= 1/10 Baseline:  Goal status: INITIAL  3.  Pt will improve Rt ankle PF ROM by 10 degrees Baseline:  Goal status: INITIAL  4.  Pt will improve FOTO to >= 77 to demo improved functional mobility Baseline:  Goal status: INITIAL  5.  Pt will improve SLS on Rt to >= 30 seconds to demo increased balance Baseline:  Goal status: INITIAL     PLAN:  PT FREQUENCY: 1x/week  PT DURATION: 6 weeks  PLANNED INTERVENTIONS: Therapeutic exercises, Therapeutic activity, Neuromuscular re-education, Balance training, Gait training, Patient/Family education, Self Care, Joint mobilization, Aquatic Therapy, Dry Needling, Electrical stimulation, Cryotherapy, Moist heat, Taping, Vasopneumatic device, Ultrasound, Ionotophoresis '4mg'$ /ml Dexamethasone, Manual therapy, and Re-evaluation  PLAN FOR NEXT SESSION: assess and progress HEP, ankle mobility and stability   Myrle Dues, PT 06/27/2022, 4:16 PM

## 2022-07-04 ENCOUNTER — Ambulatory Visit: Payer: BC Managed Care – PPO

## 2022-07-12 ENCOUNTER — Ambulatory Visit: Payer: BC Managed Care – PPO | Admitting: Physical Therapy

## 2022-07-12 ENCOUNTER — Encounter: Payer: Self-pay | Admitting: Physical Therapy

## 2022-07-12 DIAGNOSIS — R262 Difficulty in walking, not elsewhere classified: Secondary | ICD-10-CM

## 2022-07-12 DIAGNOSIS — M25571 Pain in right ankle and joints of right foot: Secondary | ICD-10-CM

## 2022-07-12 NOTE — Therapy (Addendum)
OUTPATIENT PHYSICAL THERAPY TREATMENT AND DISCHARGE   Patient Name: Cynthia [name] MRN: [id number] DOB:June 04, 1971, 51 y.o., female Today's Date: 07/12/2022  END OF SESSION:  PT End of Session - 07/12/22 0718     Visit Number 2    Number of Visits 6    Date for PT Re-Evaluation 08/08/22    PT Start Time 0718    PT Stop Time 0800    PT Time Calculation (min) 42 min    Activity Tolerance Patient tolerated treatment well    Behavior During Therapy [facility] for tasks assessed/performed            PHYSICAL THERAPY DISCHARGE SUMMARY  Visits from Start of Care: 2  Current functional level related to goals / functional outcomes: See below   Remaining deficits: See below   Education / Equipment: See below   Patient agrees to discharge. Patient goals were not met. Patient is being discharged due to not returning since the last visit.    Past Medical History:  Diagnosis Date   Anxiety    Depression    Hyperlipidemia    No pertinent past medical history    Past Surgical History:  Procedure Laterality Date   BREAST BIOPSY Right 07/16/2013   acute and chronic inflammatory changes and ectatic ducts   TONSILLECTOMY  1996   [place] EXTRACTION  1990   Patient Active Problem List   Diagnosis Date Noted   Right foot injury, medial talar dome fracture, lateral ligamentous injury 04/10/2022   Lateral epicondylitis, right elbow 03/22/2021   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   Chronic right shoulder pain 03/11/2021   Migraine without status migrainosus, not intractable 12/27/2020   Trouble in sleeping 01/07/2020   Painless rectal bleeding 11/12/2019   Breast lump 11/12/2019   Anxiety 05/19/2019   Pyelonephritis of right kidney 12/31/2018   Weight gain 02/15/2017   Appetite increase 02/15/2017   Chronic migraine 02/23/2015   Bad dreams 02/23/2015   HYPERCHOLESTEROLEMIA 04/24/2006   Major depressive disorder, recurrent episode ([place]) 04/24/2006    PCP: Iran Planas  REFERRING PROVIDER: Roma Schanz DIAG: Injury of Rt foot  THERAPY DIAG:  Pain in right ankle and joints of right foot  Difficulty in walking, not elsewhere classified  Rationale for Evaluation and Treatment: Rehabilitation  ONSET DATE: 03/2022  SUBJECTIVE:   SUBJECTIVE STATEMENT: Pt states she feels that her foot/ankle has been improving with the exercises. Pt states she did misstep a few times and felt it.    PERTINENT HISTORY: Pt has fractured Rt foot 3 times From eval: Pt broke her Rt ankle at the end of September and did not get a cast until 2 weeks later. She was in a cast for 6 weeks and then wore a brace for 2 weeks. She went to MD because she continues to feel "stiff".  She has difficulty with descending stairs. She has some "pulling" pain when she "steps weird". Pain eases with elevation. Pt states she is not using ice or meds any longer  PAIN:  Are you having pain?  Pt with no c/o pain sitting at rest. She states she only has pain if she "steps weird".  PRECAUTIONS: None  WEIGHT BEARING RESTRICTIONS: No  FALLS:  Has patient fallen in last 6 months? Yes. Number of falls 1  OCCUPATION: desk job  PATIENT GOALS: improve mobility of ankle  NEXT MD VISIT:   OBJECTIVE:   EDEMA:  Circumferential: 26cm Rt, 23.5cm Lt  PALPATION: No TTP Hypomobile with  talocrural mobs  LOWER EXTREMITY ROM:  Active ROM Right eval Left eval Right 12/27  Ankle dorsiflexion _0 Ankle plantarflexion 54 - pain 71 65 - pain  Ankle inversion 41 50   Ankle eversion 18 22    (Blank rows = not tested)  LOWER EXTREMITY MMT:  MMT Right eval Left eval  Hip flexion    Hip extension    Hip abduction    Hip adduction    Hip internal rotation    Hip external rotation    Knee flexion    Knee extension    Ankle dorsiflexion 4 4+  Ankle plantarflexion 4- 4+  Ankle inversion 4 4+  Ankle eversion 4 4+   (Blank rows = not tested)  FUNCTIONAL TESTS:  SLS Rt  13 sec, Lt 30 sec  FOTO: 64 (eval)  [facility] Adult PT Treatment:                                                DATE: 07/12/22 Therapeutic Exercise: Nustep L5 x 5 min LEs/UEs Standing Gastroc stretch 2x30 sec Soleus stretch 2x30 sec Towel scrunches 2x10 Toe yoga 2x10 Heel/toe raises 2x10 Wall squat 2x10 Knee flex + ankle DF on steps 5x10 sec; with MWM x 5 Sitting BAPs lvl 2, A/P/, lateral, CW & CCW 2x10 each Manual Therapy: Grade II to III talocrural distraction and PA/AP mobs for DF/PF Grade II to III PA mobs lateral and medial malleolus   TODAY'S TREATMENT DATE: 06/27/22 See HEP    PATIENT EDUCATION:  Education details: PT POC and goals, HEP Person educated: Patient Education method: Explanation, Demonstration, and handouts Education comprehension: verbalized understanding and returned demonstration  HOME EXERCISE PROGRAM: Access Code: [email or web address] URL: [email or web address] Date: 06/27/2022 Prepared by: Isabelle Course  Exercises - Standing Heel Raise  - 1 x daily - 7 x weekly - 3 sets - 10 reps - Single Leg Stance  - 1 x daily - 7 x weekly - 1 sets - 3 reps - 20-30 seconds hold - Soleus Stretch on Wall  - 1 x daily - 7 x weekly - 1 sets - 3 reps - 20-30 seconds hold - Seated Anterior Tibialis Stretch  - 1 x daily - 7 x weekly - 1 sets - 3 reps - 20-30 seconds hold - Seated Ankle Plantarflexion Dorsiflexion PROM  - 1 x daily - 7 x weekly - 1 sets - 3 reps - 20-30 seconds hold - Towel Scrunches  - 1 x daily - 7 x weekly - 3 sets - 10 reps - Toe Yoga - Alternating Great Toe and Lesser Toe Extension  - 1 x daily - 7 x weekly - 3 sets - 10 reps  ASSESSMENT:  CLINICAL IMPRESSION: Continued to work on improving pt's joint hypomobility with manual work. Progressed pt's strengthening exercises to standing with good pt tolerance. Some discomfort noted with heel/toe raises in standing.   OBJECTIVE IMPAIRMENTS: decreased activity tolerance, difficulty walking,  decreased ROM, decreased strength, increased edema, and pain.    GOALS: Goals reviewed with patient? Yes  SHORT TERM GOALS: Target date: 07/11/2022   Pt will be independent with initial HEP Baseline: Goal status: MET   LONG TERM GOALS: Target date: 08/08/2022    Pt will be independent with advanced HEP Baseline:  Goal status: INITIAL  2.  Pt will tolerate walking x  30 minutes with pain <= 1/10 Baseline:  Goal status: INITIAL  3.  Pt will improve Rt ankle PF ROM by 10 degrees Baseline:  Goal status: INITIAL  4.  Pt will improve FOTO to >= 77 to demo improved functional mobility Baseline:  Goal status: INITIAL  5.  Pt will improve SLS on Rt to >= 30 seconds to demo increased balance Baseline:  Goal status: INITIAL     PLAN:  PT FREQUENCY: 1x/week  PT DURATION: 6 weeks  PLANNED INTERVENTIONS: Therapeutic exercises, Therapeutic activity, Neuromuscular re-education, Balance training, Gait training, Patient/Family education, Self Care, Joint mobilization, Aquatic Therapy, Dry Needling, Electrical stimulation, Cryotherapy, Moist heat, Taping, Vasopneumatic device, Ultrasound, Ionotophoresis 34m/ml Dexamethasone, Manual therapy, and Re-evaluation  PLAN FOR NEXT SESSION: assess and progress HEP, ankle mobility and stability   Crystle Carelli April Ma L Lovetta Condie, PT 07/12/2022, 7:18 AM

## 2022-07-19 ENCOUNTER — Ambulatory Visit: Payer: BC Managed Care – PPO | Admitting: Physician Assistant

## 2022-07-19 ENCOUNTER — Telehealth: Payer: Self-pay | Admitting: Physician Assistant

## 2022-07-19 DIAGNOSIS — R7303 Prediabetes: Secondary | ICD-10-CM

## 2022-07-19 DIAGNOSIS — E559 Vitamin D deficiency, unspecified: Secondary | ICD-10-CM

## 2022-07-19 DIAGNOSIS — Z79899 Other long term (current) drug therapy: Secondary | ICD-10-CM

## 2022-07-19 DIAGNOSIS — Z1329 Encounter for screening for other suspected endocrine disorder: Secondary | ICD-10-CM

## 2022-07-19 DIAGNOSIS — E7800 Pure hypercholesterolemia, unspecified: Secondary | ICD-10-CM

## 2022-07-19 MED ORDER — FLUOXETINE HCL 20 MG PO CAPS: 20.0000 mg | ORAL_CAPSULE | Freq: Every day | ORAL | 0 refills | Status: DC

## 2022-07-19 NOTE — Telephone Encounter (Signed)
Ok to send for one month.

## 2022-07-19 NOTE — Telephone Encounter (Signed)
Patient is requesting a refill of Prozac. Patient is scheduled for 07/24/2022 at 3:20pm.  Please send to [facility] on [address] in [place], [place].

## 2022-07-19 NOTE — Telephone Encounter (Signed)
RX sent

## 2022-07-24 ENCOUNTER — Ambulatory Visit: Payer: BC Managed Care – PPO | Admitting: Physician Assistant

## 2022-07-24 VITALS — BP 116/78 | HR 106 | Ht 66.0 in | Wt 202.0 lb

## 2022-07-24 DIAGNOSIS — N390 Urinary tract infection, site not specified: Secondary | ICD-10-CM

## 2022-07-24 DIAGNOSIS — R7303 Prediabetes: Secondary | ICD-10-CM | POA: Diagnosis not present

## 2022-07-24 DIAGNOSIS — F419 Anxiety disorder, unspecified: Secondary | ICD-10-CM | POA: Diagnosis not present

## 2022-07-24 DIAGNOSIS — F332 Major depressive disorder, recurrent severe without psychotic features: Secondary | ICD-10-CM | POA: Diagnosis not present

## 2022-07-24 DIAGNOSIS — F331 Major depressive disorder, recurrent, moderate: Secondary | ICD-10-CM

## 2022-07-24 DIAGNOSIS — E7800 Pure hypercholesterolemia, unspecified: Secondary | ICD-10-CM | POA: Diagnosis not present

## 2022-07-24 DIAGNOSIS — E6609 Other obesity due to excess calories: Secondary | ICD-10-CM

## 2022-07-24 DIAGNOSIS — Z131 Encounter for screening for diabetes mellitus: Secondary | ICD-10-CM

## 2022-07-24 DIAGNOSIS — E66811 Obesity, class 1: Secondary | ICD-10-CM

## 2022-07-24 DIAGNOSIS — E559 Vitamin D deficiency, unspecified: Secondary | ICD-10-CM

## 2022-07-24 DIAGNOSIS — Z23 Encounter for immunization: Secondary | ICD-10-CM | POA: Diagnosis not present

## 2022-07-24 DIAGNOSIS — Z1329 Encounter for screening for other suspected endocrine disorder: Secondary | ICD-10-CM

## 2022-07-24 DIAGNOSIS — Z1159 Encounter for screening for other viral diseases: Secondary | ICD-10-CM

## 2022-07-24 DIAGNOSIS — Z13220 Encounter for screening for lipoid disorders: Secondary | ICD-10-CM

## 2022-07-24 DIAGNOSIS — Z6832 Body mass index (BMI) 32.0-32.9, adult: Secondary | ICD-10-CM

## 2022-07-24 DIAGNOSIS — Z79899 Other long term (current) drug therapy: Secondary | ICD-10-CM

## 2022-07-24 MED ORDER — BUPROPION HCL ER (XL) 150 MG PO TB24: 150.0000 mg | ORAL_TABLET | Freq: Every day | ORAL | 1 refills | Status: DC

## 2022-07-24 MED ORDER — ZEPBOUND 5 MG/0.5ML ~~LOC~~ SOAJ: 5.0000 mg | SUBCUTANEOUS | 1 refills | Status: DC

## 2022-07-24 MED ORDER — NITROFURANTOIN MONOHYD MACRO 100 MG PO CAPS: 100.0000 mg | ORAL_CAPSULE | Freq: Every day | ORAL | 3 refills | Status: DC

## 2022-07-24 MED ORDER — ZEPBOUND 2.5 MG/0.5ML ~~LOC~~ SOAJ: 2.5000 mg | SUBCUTANEOUS | 0 refills | Status: DC

## 2022-07-24 MED ORDER — FLUOXETINE HCL 40 MG PO CAPS: 40.0000 mg | ORAL_CAPSULE | Freq: Every day | ORAL | 1 refills | Status: DC

## 2022-07-24 NOTE — Progress Notes (Unsigned)
Established Patient Office Visit  Subjective   Patient ID: Cynthia [name], female    DOB: 12-01-1970  Age: 52 y.o. MRN: [id number]  Chief Complaint  Patient presents with   Follow-up    HPI Pt is a 52 yo female with MDD and anxiety who presents to the clinic for follow up. She has had some life events that have led to more depressive symptoms. She is in counseling once a week for this. She denies any SI/HC. She is taking her prozac and wellbutrin.   Recurrent UTI needs refills of macrobid to take daily. No recent UTI symptoms.  She is frustrated with her weight. She is exercising and eating healthy but no weight loss. She has tried phentermine in the past but gained weight back after stopping.   .. Active Ambulatory Problems    Diagnosis Date Noted   HYPERCHOLESTEROLEMIA 04/24/2006   Major depressive disorder, recurrent episode ([place]) 04/24/2006   Chronic migraine 02/23/2015   Bad dreams 02/23/2015   Weight gain 02/15/2017   Appetite increase 02/15/2017   Pyelonephritis of right kidney 12/31/2018   Anxiety 05/19/2019   Painless rectal bleeding 11/12/2019   Breast lump 11/12/2019   Trouble in sleeping 01/07/2020   Migraine without status migrainosus, not intractable 12/27/2020   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   Chronic right shoulder pain 03/11/2021   Lateral epicondylitis, right elbow 03/22/2021   Right foot injury, medial talar dome fracture, lateral ligamentous injury 04/10/2022   Hemorrhoids 04/20/2022   Menopausal syndrome 01/31/2022   Prediabetes 07/30/2020   Vitamin D deficiency 07/30/2020   Resolved Ambulatory Problems    Diagnosis Date Noted   INFLUENZA, WITH RESPIRATORY SYMPTOMS 04/09/2008   BMI 30.0-30.9,adult 03/14/2017   BMI 27.0-27.9,adult 01/02/2018   Past Medical History:  Diagnosis Date   Depression    Hyperlipidemia    No pertinent past medical history       ROS See HPI.    Objective:     BP 116/78   Pulse (!) 106   Ht '5\' 6"'$  (1.676  m)   Wt 202 lb (91.6 kg)   SpO2 98%   BMI 32.60 kg/m  BP Readings from Last 3 Encounters:  07/24/22 116/78  09/01/21 112/71  08/10/21 (!) 137/91   Wt Readings from Last 3 Encounters:  07/24/22 202 lb (91.6 kg)  09/01/21 185 lb (83.9 kg)  08/18/21 185 lb (83.9 kg)     ..    07/24/2022    4:20 PM 12/27/2020    9:48 AM 01/07/2020    8:56 AM 05/19/2019    4:44 PM 01/02/2018    7:55 AM  Depression screen PHQ 2/9  Decreased Interest 2 0 '2 2 1  '$ Down, Depressed, Hopeless 3 0 0 1 0  PHQ - 2 Score 5 0 '2 3 1  '$ Altered sleeping '3 1 3 3 1  '$ Tired, decreased energy '3 2 1 3 2  '$ Change in appetite '3 2 2 2 2  '$ Feeling bad or failure about yourself  2 0 0 1   Trouble concentrating '3 2 2 1 1  '$ Moving slowly or fidgety/restless 0 0 0 0 0  Suicidal thoughts 0 0 0 0 0  PHQ-9 Score '19 7 10 13 7  '$ Difficult doing work/chores Very difficult Very difficult Somewhat difficult Somewhat difficult Somewhat difficult   ..    07/24/2022    4:20 PM 12/27/2020    9:48 AM 01/07/2020    8:57 AM 05/19/2019    4:45 PM  GAD 7 : Generalized Anxiety Score  Nervous, Anxious, on Edge '1 1 1 3  '$ Control/stop worrying '2 1 2 3  '$ Worry too much - different things '1 1 1 3  '$ Trouble relaxing '2 1 1 2  '$ Restless 0 1 0 0  Easily annoyed or irritable 0 '1 1 3  '$ Afraid - awful might happen '1 1 2 1  '$ Total GAD 7 Score '7 7 8 15  '$ Anxiety Difficulty Very difficult Somewhat difficult Somewhat difficult Somewhat difficult     Physical Exam Constitutional:      Appearance: Normal appearance. She is obese.  HENT:     Head: Normocephalic.  Cardiovascular:     Rate and Rhythm: Normal rate and regular rhythm.     Pulses: Normal pulses.     Heart sounds: Normal heart sounds.  Pulmonary:     Effort: Pulmonary effort is normal.  Musculoskeletal:     Right lower leg: No edema.     Left lower leg: No edema.  Neurological:     General: No focal deficit present.     Mental Status: She is alert and oriented to person, place, and time.          Assessment & Plan:  Marland KitchenMarland KitchenMarlene was seen today for follow-up.  Diagnoses and all orders for this visit:  Severe episode of recurrent major depressive disorder, without psychotic features (HCC) -     FLUoxetine (PROZAC) 40 MG capsule; Take 1 capsule (40 mg total) by mouth daily.  Pure hypercholesterolemia -     Lipid Panel w/reflex Direct LDL  Thyroid disorder screen -     TSH  Screening for lipid disorders -     Lipid Panel w/reflex Direct LDL  Screening for diabetes mellitus -     Hemoglobin A1c -     COMPLETE METABOLIC PANEL WITH GFR  Vitamin D deficiency -     Vitamin D (25 hydroxy)  Prediabetes -     Hemoglobin A1c -     COMPLETE METABOLIC PANEL WITH GFR  Medication management -     Vitamin D (25 hydroxy) -     Hemoglobin A1c -     TSH -     Lipid Panel w/reflex Direct LDL -     COMPLETE METABOLIC PANEL WITH GFR -     CBC with Differential/Platelet  Need for Tdap vaccination -     Tdap vaccine greater than or equal to 7yo IM  Anxiety -     buPROPion (WELLBUTRIN XL) 150 MG 24 hr tablet; Take 1 tablet (150 mg total) by mouth daily.  Moderate episode of recurrent major depressive disorder (HCC) -     buPROPion (WELLBUTRIN XL) 150 MG 24 hr tablet; Take 1 tablet (150 mg total) by mouth daily.  Encounter for hepatitis C screening test for low risk patient -     Hepatitis C Antibody  Class 1 obesity due to excess calories without serious comorbidity with body mass index (BMI) of 32.0 to 32.9 in adult -     tirzepatide (ZEPBOUND) 2.5 MG/0.5ML Pen; Inject 2.5 mg into the skin once a week. -     tirzepatide (ZEPBOUND) 5 MG/0.5ML Pen; Inject 5 mg into the skin once a week.  Recurrent UTI -     nitrofurantoin, macrocrystal-monohydrate, (MACROBID) 100 MG capsule; Take 1 capsule (100 mg total) by mouth at bedtime.   PHQ 9 was increased GAD stable Increased prozac to '40mg'$  Continue wellbutrin Continue weekly counseling   Refilled macrobid  for recurrent  UTI prevention.   Marland Kitchen.Discussed low carb diet with 1500 calories and 80g of protein.  Exercising at least 150 minutes a week.  [employer]itness Pal could be a Microbiologist.  Zepbound sent to pharmacy with 2 doses Discussed SE Fasting labs for screening purposes Follow up in 3 months  Iran Planas, PA-C

## 2022-07-25 ENCOUNTER — Encounter: Payer: Self-pay | Admitting: Physician Assistant

## 2022-07-25 ENCOUNTER — Encounter: Payer: BC Managed Care – PPO | Admitting: Physical Therapy

## 2022-07-25 ENCOUNTER — Ambulatory Visit: Payer: BC Managed Care – PPO | Admitting: Physician Assistant

## 2022-07-26 LAB — CBC WITH DIFFERENTIAL/PLATELET
Absolute Monocytes: 473 cells/uL (ref 200–950)
Basophils Absolute: 103 cells/uL (ref 0–200)
Basophils Relative: 1.2 %
Eosinophils Absolute: 473 cells/uL (ref 15–500)
Eosinophils Relative: 5.5 %
HCT: 45.1 % — ABNORMAL HIGH (ref 35.0–45.0)
Hemoglobin: 14.9 g/dL (ref 11.7–15.5)
Lymphs Abs: 2133 cells/uL (ref 850–3900)
MCH: 28.3 pg (ref 27.0–33.0)
MCHC: 33 g/dL (ref 32.0–36.0)
MCV: 85.7 fL (ref 80.0–100.0)
MPV: 10.2 fL (ref 7.5–12.5)
Monocytes Relative: 5.5 %
Neutro Abs: 5418 cells/uL (ref 1500–7800)
Neutrophils Relative %: 63 %
Platelets: 381 10*3/uL (ref 140–400)
RBC: 5.26 10*6/uL — ABNORMAL HIGH (ref 3.80–5.10)
RDW: 12.6 % (ref 11.0–15.0)
Total Lymphocyte: 24.8 %
WBC: 8.6 10*3/uL (ref 3.8–10.8)

## 2022-07-26 LAB — COMPLETE METABOLIC PANEL WITH GFR
AG Ratio: 1.7 (calc) (ref 1.0–2.5)
ALT: 17 U/L (ref 6–29)
AST: 14 U/L (ref 10–35)
Albumin: 4.4 g/dL (ref 3.6–5.1)
Alkaline phosphatase (APISO): 118 U/L (ref 37–153)
BUN: 17 mg/dL (ref 7–25)
CO2: 27 mmol/L (ref 20–32)
Calcium: 9.7 mg/dL (ref 8.6–10.4)
Chloride: 106 mmol/L (ref 98–110)
Creat: 0.88 mg/dL (ref 0.50–1.03)
Globulin: 2.6 g/dL (calc) (ref 1.9–3.7)
Glucose, Bld: 89 mg/dL (ref 65–99)
Potassium: 4.7 mmol/L (ref 3.5–5.3)
Sodium: 142 mmol/L (ref 135–146)
Total Bilirubin: 0.5 mg/dL (ref 0.2–1.2)
Total Protein: 7 g/dL (ref 6.1–8.1)
eGFR: 80 mL/min/{1.73_m2} (ref >=60)

## 2022-07-26 LAB — TSH: TSH: 2.66 mIU/L

## 2022-07-26 LAB — LIPID PANEL W/REFLEX DIRECT LDL
Cholesterol: 247 mg/dL — ABNORMAL HIGH (ref <200)
HDL: 62 mg/dL (ref >=50)
LDL Cholesterol (Calc): 160 mg/dL (calc) — ABNORMAL HIGH
Non-HDL Cholesterol (Calc): 185 mg/dL (calc) — ABNORMAL HIGH (ref <130)
Total CHOL/HDL Ratio: 4 (calc) (ref <5.0)
Triglycerides: 128 mg/dL (ref <150)

## 2022-07-26 LAB — HEPATITIS C ANTIBODY: Hepatitis C Ab: NONREACTIVE

## 2022-07-26 LAB — HEMOGLOBIN A1C
Hgb A1c MFr Bld: 5.7 % of total Hgb — ABNORMAL HIGH (ref <5.7)
Mean Plasma Glucose: 117 mg/dL
eAG (mmol/L): 6.5 mmol/L

## 2022-07-26 LAB — VITAMIN D 25 HYDROXY (VIT D DEFICIENCY, FRACTURES): Vit D, 25-Hydroxy: 24 ng/mL — ABNORMAL LOW (ref 30–100)

## 2022-07-26 NOTE — Progress Notes (Signed)
Cynthia [name],   Kidney, liver, glucose look great.  Thyroid normal.  A1C is pre-diabetes range. Weight loss with exercise and limiting sugars and carbs will help to prevent Vitamin D is low. Make sure taking at lease 1000 if not 2000 units a day with dairy.   LDL elevated. 10 year risk under 7.5 percent.  Continue to work on diet and exercise. Recheck in 1 year.   Marland Kitchen.The 10-year ASCVD risk score (Arnett DK, et al., 2019) is: 1.2%   Values used to calculate the score:     Age: 52 years     Sex: Female     Is Non-Hispanic African American: No     Diabetic: No     Tobacco smoker: No     Systolic Blood Pressure: 456 mmHg     Is BP treated: No     HDL Cholesterol: 62 mg/dL     Total Cholesterol: 247 mg/dL

## 2022-08-01 ENCOUNTER — Other Ambulatory Visit: Payer: Self-pay | Admitting: Physician Assistant

## 2022-08-01 DIAGNOSIS — E6609 Other obesity due to excess calories: Secondary | ICD-10-CM

## 2022-08-02 ENCOUNTER — Encounter: Payer: Self-pay | Admitting: Physician Assistant

## 2022-08-04 ENCOUNTER — Telehealth: Payer: Self-pay

## 2022-08-04 NOTE — Telephone Encounter (Signed)
Initiated Prior authorization QZE:[id number] (titrated doses) Via: Covermymeds Case/Key:[id number] Status: approved as of 08/04/21 Reason:Coverage Start Date:07/05/2022;Coverage End Date:04/01/2023; Notified Pt via: Mychart   Pt has [employer] different id number [id number]

## 2022-10-06 ENCOUNTER — Ambulatory Visit: Payer: BC Managed Care – PPO | Admitting: Physician Assistant

## 2022-10-11 ENCOUNTER — Encounter: Payer: Self-pay | Admitting: Physician Assistant

## 2022-10-11 ENCOUNTER — Ambulatory Visit: Payer: BC Managed Care – PPO | Admitting: Physician Assistant

## 2022-10-11 VITALS — BP 113/74 | HR 90 | Ht 66.0 in | Wt 181.0 lb

## 2022-10-11 DIAGNOSIS — Z1231 Encounter for screening mammogram for malignant neoplasm of breast: Secondary | ICD-10-CM

## 2022-10-11 DIAGNOSIS — R7303 Prediabetes: Secondary | ICD-10-CM | POA: Diagnosis not present

## 2022-10-11 DIAGNOSIS — Z8639 Personal history of other endocrine, nutritional and metabolic disease: Secondary | ICD-10-CM

## 2022-10-11 DIAGNOSIS — E663 Overweight: Secondary | ICD-10-CM | POA: Diagnosis not present

## 2022-10-11 MED ORDER — ZEPBOUND 5 MG/0.5ML ~~LOC~~ SOAJ: 5.0000 mg | SUBCUTANEOUS | 1 refills | Status: DC

## 2022-10-11 NOTE — Progress Notes (Signed)
Established Patient Office Visit  Subjective   Patient ID: Cynthia [name], female    DOB: [birth year]  Age: 52 y.o. MRN: [id number]  Chief Complaint  Patient presents with   Follow-up    HPI Pt is a 52 yo female who presents to the clinic to follow up on weight. She has lost 21lbs in 3 months on zepbound. She is doing great. She denies any side effects. She is exercising and drinking more water. She would like to continue on same dose.    Patient Active Problem List   Diagnosis Date Noted   History of obesity 10/11/2022   Hemorrhoids 04/20/2022   Menopausal syndrome 01/31/2022   Lateral epicondylitis, right elbow 03/22/2021   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   Chronic right shoulder pain 03/11/2021   Migraine without status migrainosus, not intractable 12/27/2020   Prediabetes 07/30/2020   Vitamin D deficiency 07/30/2020   Trouble in sleeping 01/07/2020   Painless rectal bleeding 11/12/2019   Anxiety 05/19/2019   Pyelonephritis of right kidney 12/31/2018   Overweight (BMI 25.0-29.9) 01/02/2018   Chronic migraine 02/23/2015   Bad dreams 02/23/2015   HYPERCHOLESTEROLEMIA 04/24/2006   Major depressive disorder, recurrent episode ([place]) 04/24/2006   Past Medical History:  Diagnosis Date   Anxiety    Depression    Hyperlipidemia    No pertinent past medical history    Family Status  Relation Name Status   Mother  (Not Specified)   Father  (Not Specified)   Sister  (Not Specified)   MGM  (Not Specified)   PGF  (Not Specified)   Neg Hx  (Not Specified)   Family History  Problem Relation Age of Onset   Parkinson's disease Mother    Colon polyps Father    Diabetes Father    Skin cancer Father    Lupus Sister    Breast cancer Sister    Cancer Maternal Grandmother    Breast cancer Maternal Grandmother    Alcohol abuse Paternal Grandfather    Stomach cancer Neg Hx    Rectal cancer Neg Hx    Esophageal cancer Neg Hx    Colon cancer Neg Hx    Allergies  Allergen  Reactions   Promethazine Hcl     REACTION: convulsion   Trazodone And Nefazodone     Crazy feelings.       Review of Systems  All other systems reviewed and are negative.     Objective:     BP 113/74   Pulse 90   Ht 5\' 6"  (1.676 m)   Wt 181 lb (82.1 kg)   SpO2 98%   BMI 29.21 kg/m  BP Readings from Last 3 Encounters:  10/11/22 113/74  07/24/22 116/78  09/01/21 112/71   Wt Readings from Last 3 Encounters:  10/11/22 181 lb (82.1 kg)  07/24/22 202 lb (91.6 kg)  09/01/21 185 lb (83.9 kg)      Physical Exam Constitutional:      Appearance: Normal appearance. She is obese.  HENT:     Head: Normocephalic.  Cardiovascular:     Rate and Rhythm: Normal rate and regular rhythm.  Pulmonary:     Effort: Pulmonary effort is normal.     Breath sounds: Normal breath sounds.  Neurological:     General: No focal deficit present.     Mental Status: She is alert and oriented to person, place, and time.  Psychiatric:        Mood and Affect: Mood normal.  Assessment & Plan:  Marland KitchenMarland KitchenLakiera was seen today for follow-up.  Diagnoses and all orders for this visit:  Overweight (BMI 25.0-29.9) -     tirzepatide (ZEPBOUND) 5 MG/0.5ML Pen; Inject 5 mg into the skin once a week.  History of obesity -     tirzepatide (ZEPBOUND) 5 MG/0.5ML Pen; Inject 5 mg into the skin once a week.  Encounter for screening mammogram for malignant neoplasm of breast -     MM 3D SCREENING MAMMOGRAM BILATERAL BREAST  Prediabetes -     tirzepatide (ZEPBOUND) 5 MG/0.5ML Pen; Inject 5 mg into the skin once a week.   Weight loss is great Continue on zepbound 5mg  weekly Continue with exercise and healthy diet Follow up in 6 months Goal weight is 150lbs  Return in about 6 months (around 04/13/2023).    Iran Planas, PA-C

## 2022-10-24 ENCOUNTER — Other Ambulatory Visit: Payer: Self-pay | Admitting: Physician Assistant

## 2022-10-24 DIAGNOSIS — R7303 Prediabetes: Secondary | ICD-10-CM

## 2022-10-24 DIAGNOSIS — E663 Overweight: Secondary | ICD-10-CM

## 2022-10-24 DIAGNOSIS — Z8639 Personal history of other endocrine, nutritional and metabolic disease: Secondary | ICD-10-CM

## 2022-10-25 ENCOUNTER — Encounter: Payer: Self-pay | Admitting: Physician Assistant

## 2022-10-25 DIAGNOSIS — Z8639 Personal history of other endocrine, nutritional and metabolic disease: Secondary | ICD-10-CM

## 2022-10-25 DIAGNOSIS — R7303 Prediabetes: Secondary | ICD-10-CM

## 2022-10-25 DIAGNOSIS — E663 Overweight: Secondary | ICD-10-CM

## 2022-10-26 ENCOUNTER — Other Ambulatory Visit: Payer: Self-pay | Admitting: Physician Assistant

## 2022-10-26 DIAGNOSIS — E663 Overweight: Secondary | ICD-10-CM

## 2022-10-26 DIAGNOSIS — Z8639 Personal history of other endocrine, nutritional and metabolic disease: Secondary | ICD-10-CM

## 2022-10-26 DIAGNOSIS — R7303 Prediabetes: Secondary | ICD-10-CM

## 2022-10-30 NOTE — Telephone Encounter (Signed)
Per pharmacy -  Product is on Backordered/Unavailable:  

## 2022-11-21 ENCOUNTER — Other Ambulatory Visit: Payer: Self-pay

## 2022-11-21 DIAGNOSIS — F331 Major depressive disorder, recurrent, moderate: Secondary | ICD-10-CM

## 2022-11-21 DIAGNOSIS — N390 Urinary tract infection, site not specified: Secondary | ICD-10-CM

## 2022-11-21 DIAGNOSIS — F332 Major depressive disorder, recurrent severe without psychotic features: Secondary | ICD-10-CM

## 2022-11-21 DIAGNOSIS — F419 Anxiety disorder, unspecified: Secondary | ICD-10-CM

## 2022-11-21 MED ORDER — BUPROPION HCL ER (XL) 150 MG PO TB24: 150.0000 mg | ORAL_TABLET | Freq: Every day | ORAL | 0 refills | Status: DC

## 2022-11-21 MED ORDER — NITROFURANTOIN MONOHYD MACRO 100 MG PO CAPS: 100.0000 mg | ORAL_CAPSULE | Freq: Every day | ORAL | 0 refills | Status: DC

## 2022-11-21 MED ORDER — FLUOXETINE HCL 40 MG PO CAPS: 40.0000 mg | ORAL_CAPSULE | Freq: Every day | ORAL | 0 refills | Status: DC

## 2022-12-01 MED ORDER — ZEPBOUND 2.5 MG/0.5ML ~~LOC~~ SOAJ: 2.5000 mg | SUBCUTANEOUS | 0 refills | Status: DC

## 2022-12-20 ENCOUNTER — Ambulatory Visit (INDEPENDENT_AMBULATORY_CARE_PROVIDER_SITE_OTHER): Payer: BC Managed Care – PPO

## 2022-12-20 DIAGNOSIS — Z1231 Encounter for screening mammogram for malignant neoplasm of breast: Secondary | ICD-10-CM

## 2022-12-22 NOTE — Progress Notes (Signed)
Normal mammogram. Follow up in 1 yr.

## 2022-12-29 MED ORDER — ZEPBOUND 2.5 MG/0.5ML ~~LOC~~ SOAJ: 2.5000 mg | SUBCUTANEOUS | 0 refills | Status: DC

## 2022-12-29 NOTE — Addendum Note (Signed)
Addended by: Chalmers Cater on: 12/29/2022 02:23 PM   Modules accepted: Orders

## 2023-01-23 IMAGING — DX DG ELBOW COMPLETE 3+V*R*
4 series · 4 of 4 positions shown · non-contrast
Comparison: None.

CLINICAL DATA: Recent fall 2 weeks ago, persistent pain

EXAM:
RIGHT ELBOW - COMPLETE 3+ VIEW

[elbow ap]
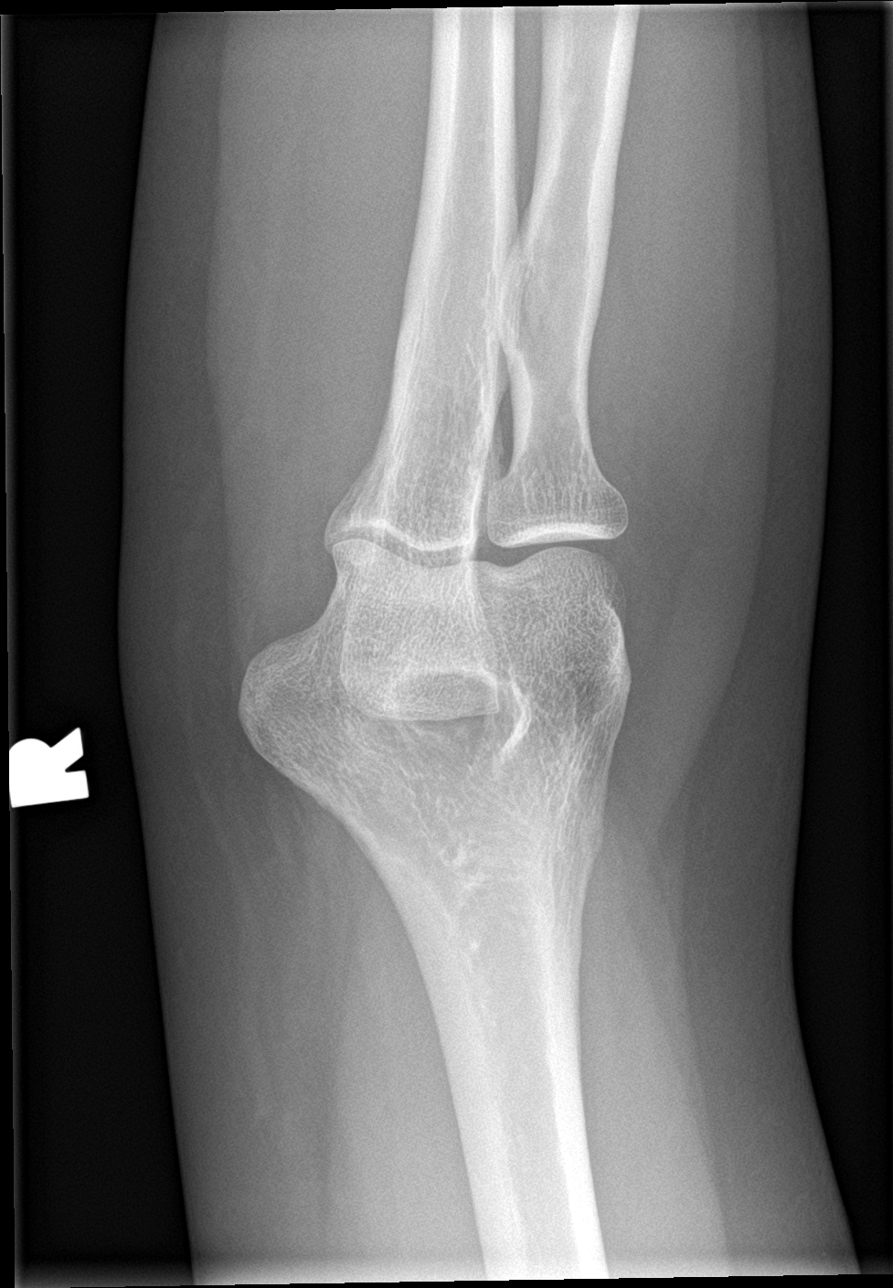

[elbow lat]
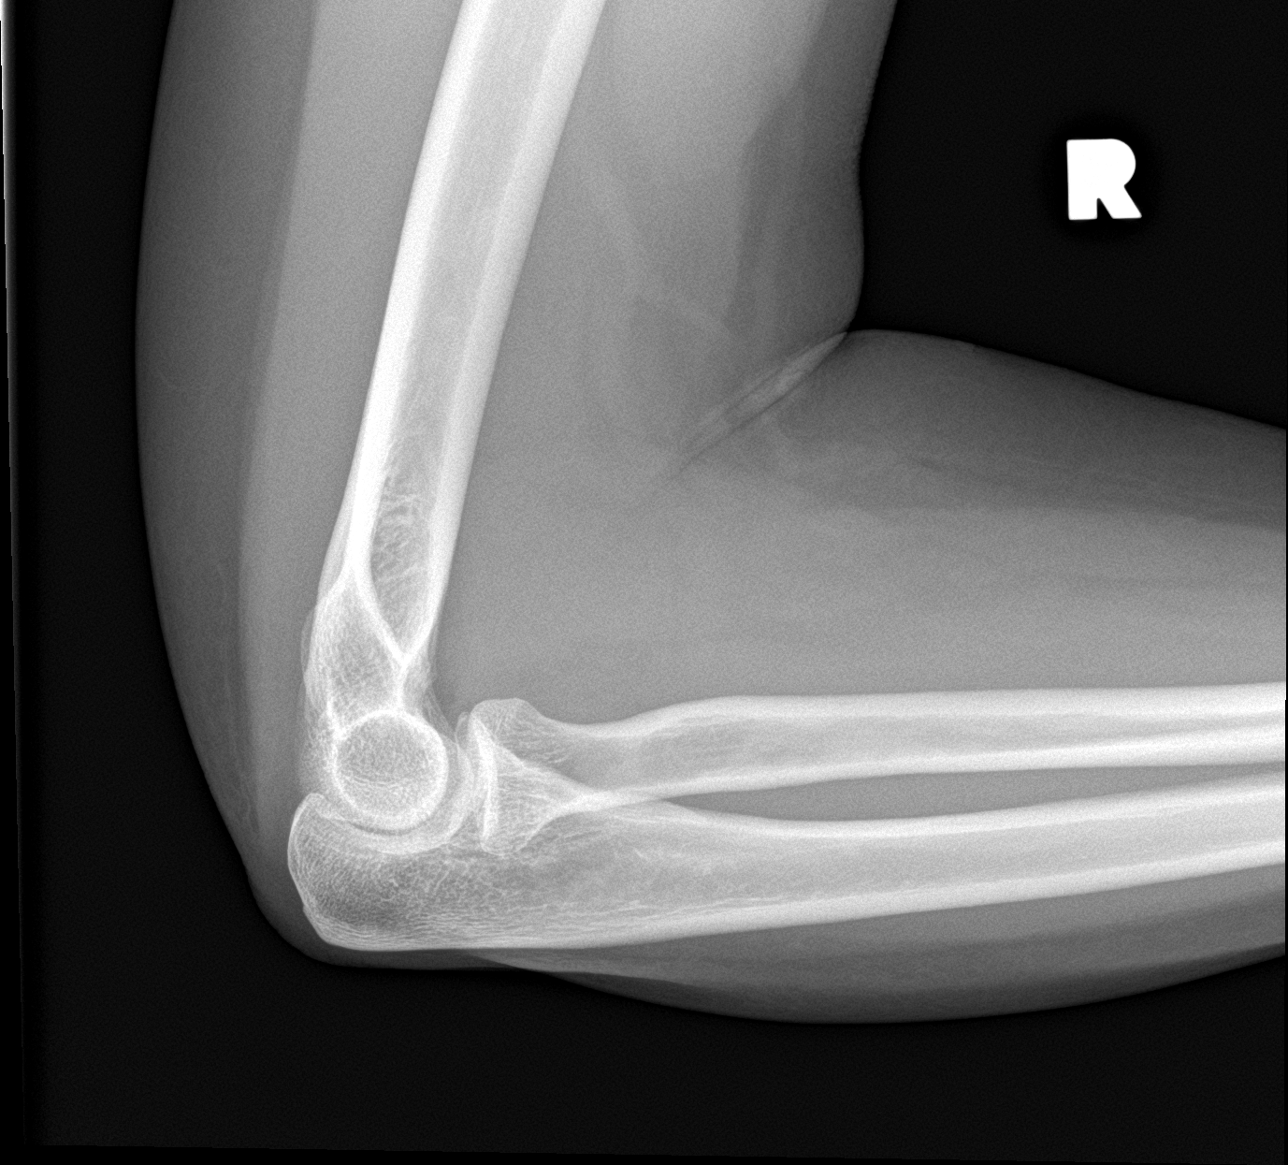

[elbow obl (1 of 2)]
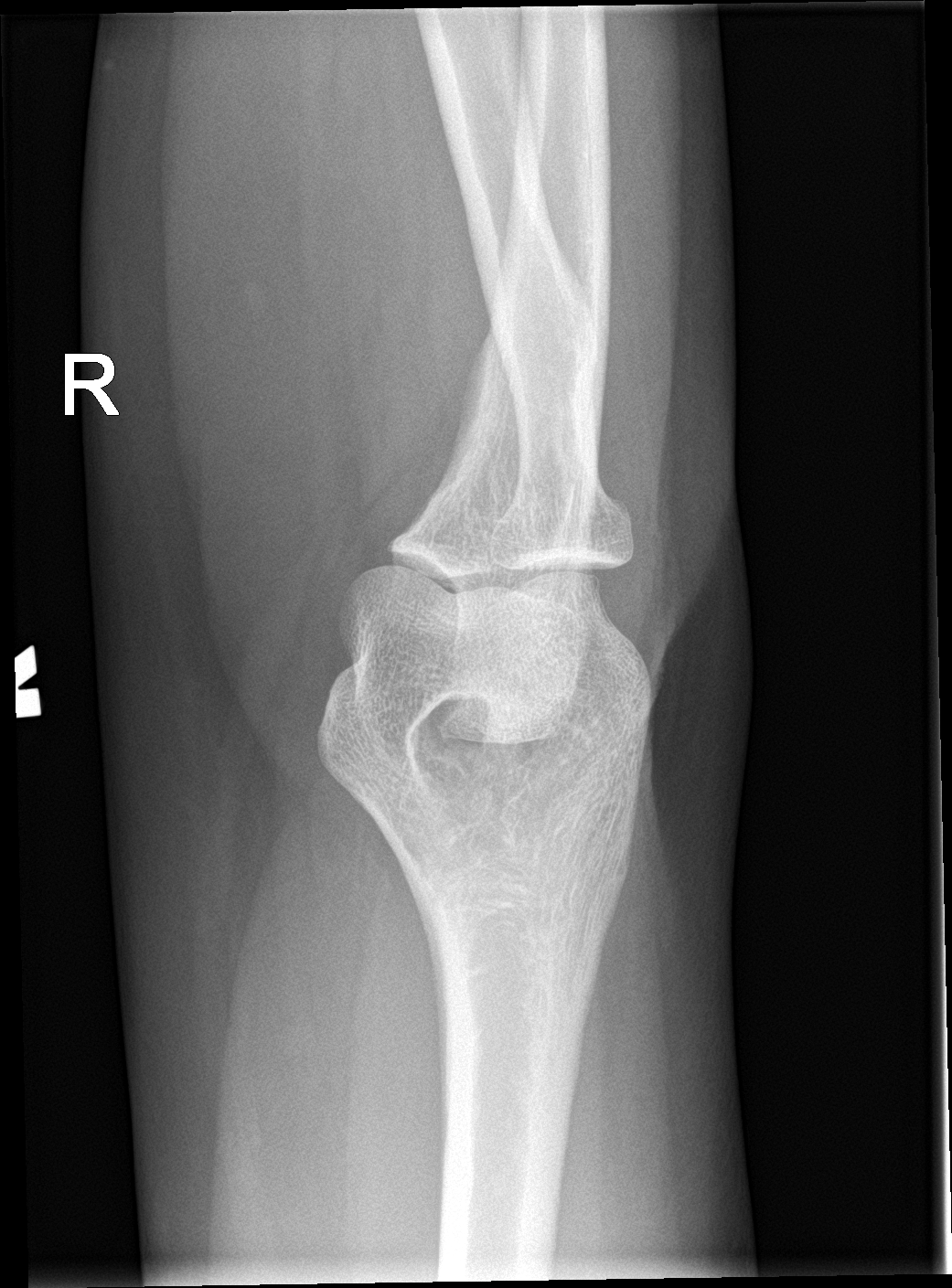

[elbow obl (2 of 2)]
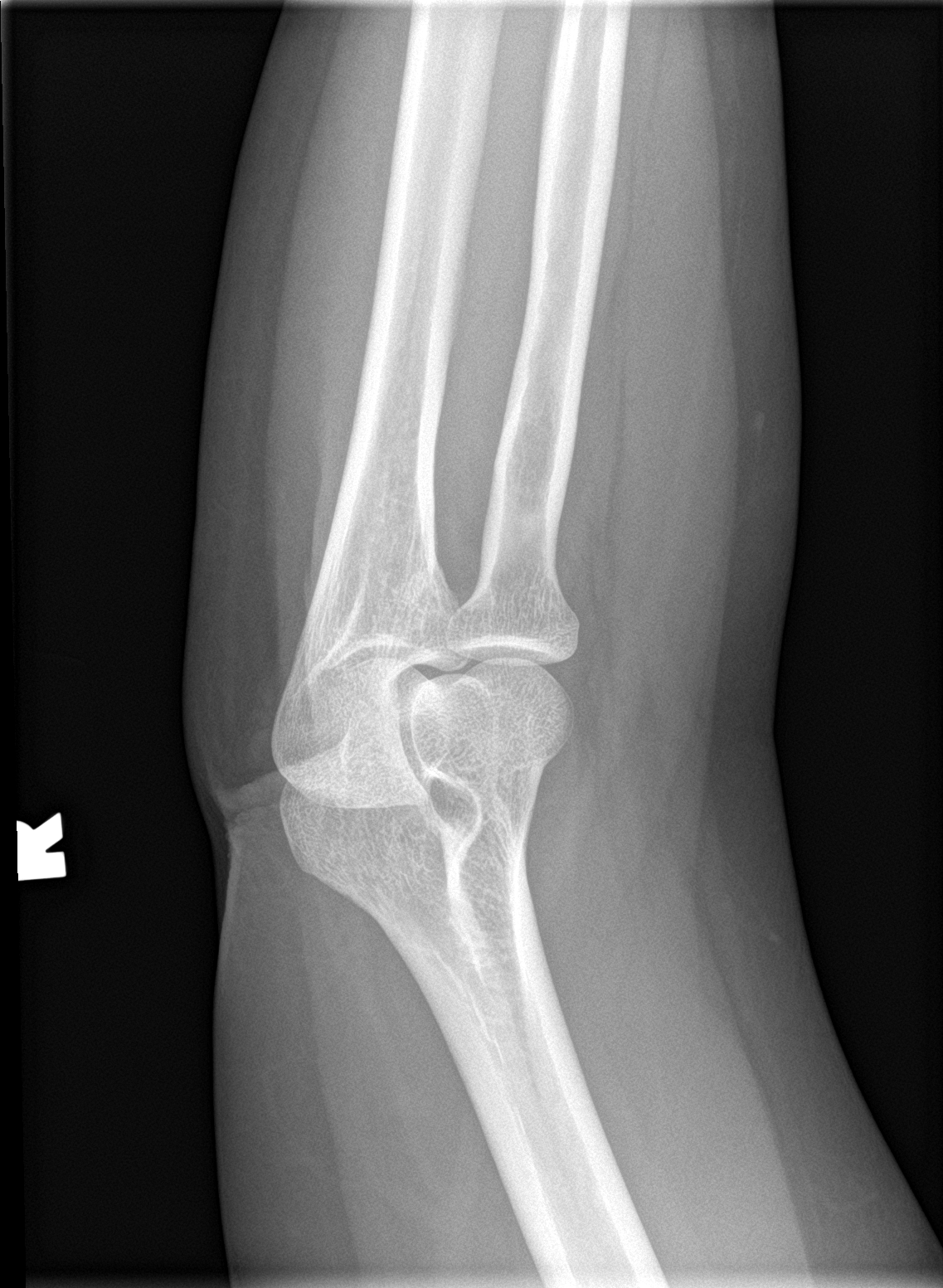

[4 of 4 positions shown; findings below may reference images not displayed]

FINDINGS: There is no evidence of fracture, dislocation, or joint effusion.
There is no evidence of arthropathy or other focal bone abnormality.
Soft tissues are unremarkable.
IMPRESSION: Negative.

## 2023-01-29 IMAGING — MR MR ELBOW*R* W/O CM
5 series · 40 of 40 positions shown · non-contrast
Comparison: None.

CLINICAL DATA: Right elbow pain, limited range of motion

EXAM:
MRI OF THE RIGHT ELBOW WITHOUT CONTRAST
TECHNIQUE: Multiplanar, multisequence MR imaging of the elbow was performed. No
intravenous contrast was administered.

[Series 3: T1 · axial · 4.0mm · 0.44mm/px · z∈[-44,+106]mm · 11 of 35 slices shown (1 of 2)]
[im 1/35]
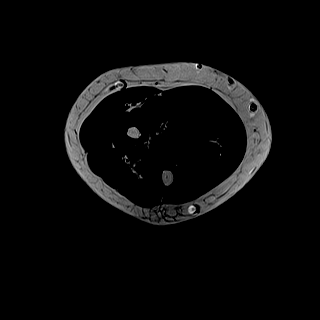
[im 4/35]
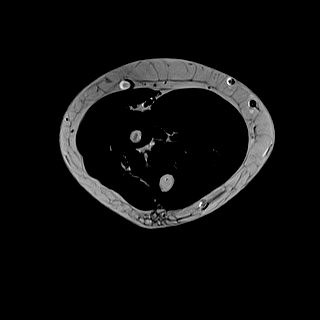
[im 7/35]
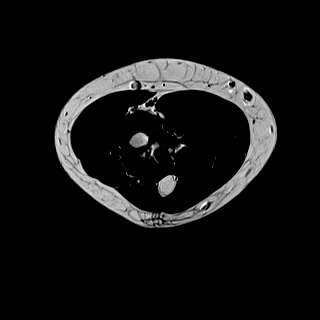
[im 11/35]
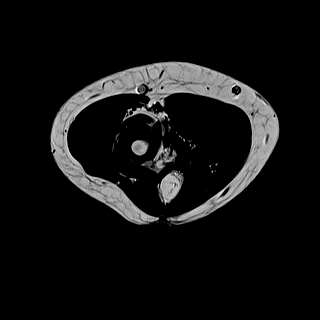
[im 14/35]
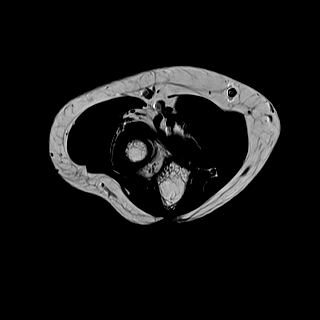
[im 18/35]
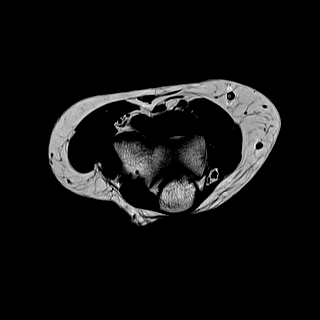
[im 21/35]
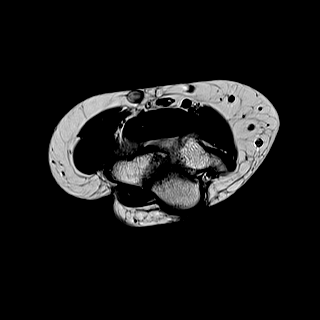
[im 24/35]
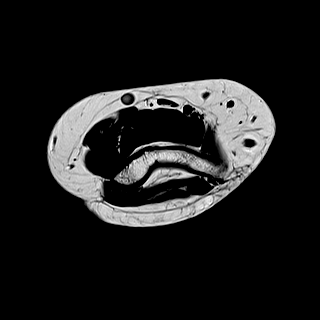
[im 28/35]
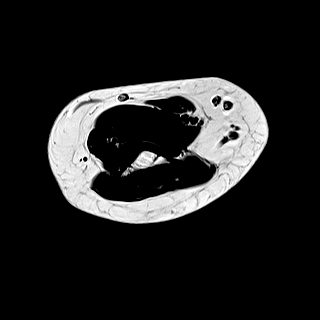
[im 31/35]
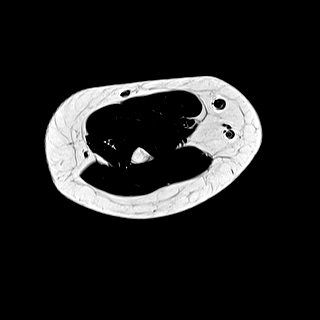
[im 35/35]
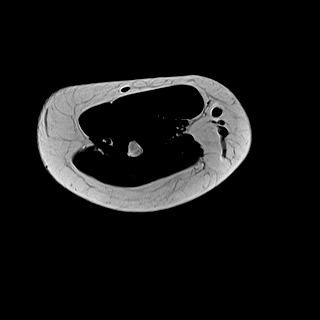

[Series 4: T2 fat-sat · axial · 4.0mm · 0.55mm/px · z∈[-44,+106]mm · 10 of 35 slices shown (1 of 3)]
[im 1/35]
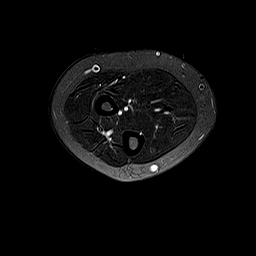
[im 4/35]
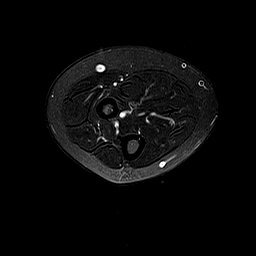
[im 8/35]
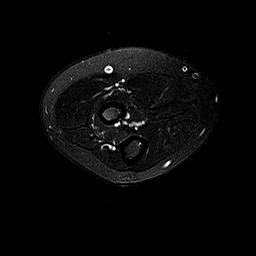
[im 12/35]
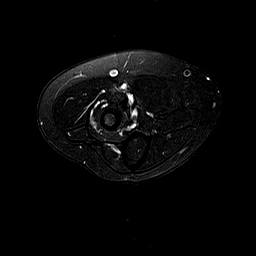
[im 16/35]
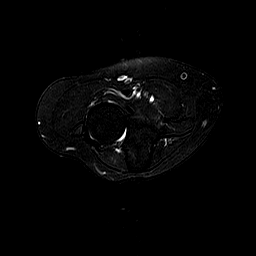
[im 19/35]
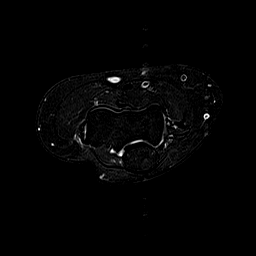
[im 23/35]
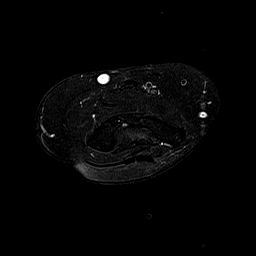
[im 27/35]
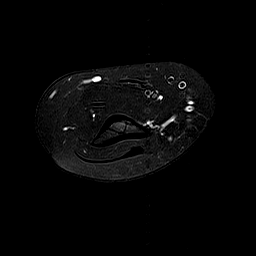
[im 31/35]
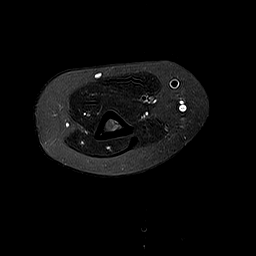
[im 35/35]
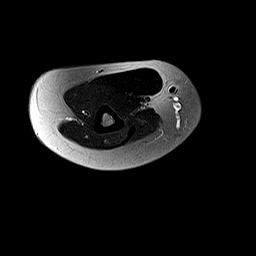

[Series 5: T1 · coronal · 4.0mm · 0.50mm/px · 5 of 19 slices shown (2 of 2)]
[im 1/19]
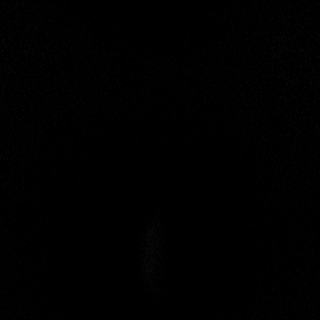
[im 5/19]
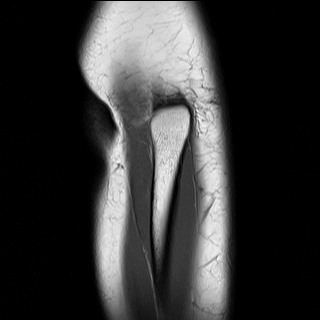
[im 10/19]
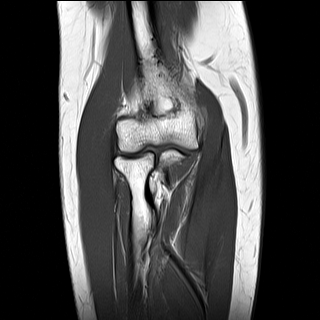
[im 14/19]
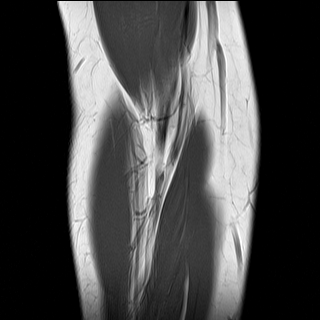
[im 19/19]
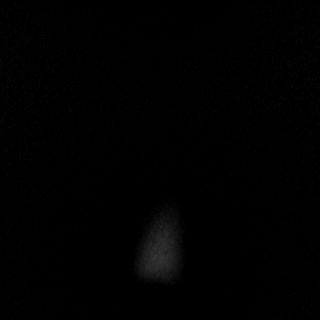

[Series 6: T2 fat-sat · coronal · 4.0mm · 0.55mm/px · 5 of 19 slices shown (2 of 3)]
[im 1/19]
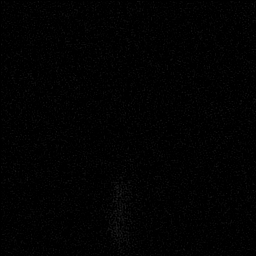
[im 5/19]
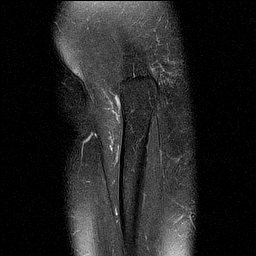
[im 10/19]
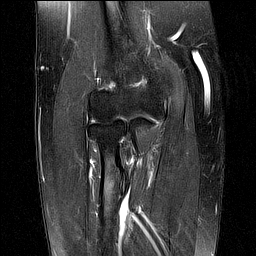
[im 14/19]
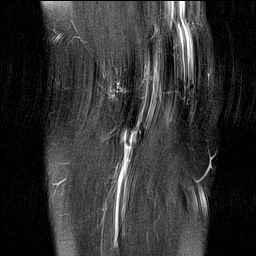
[im 19/19]
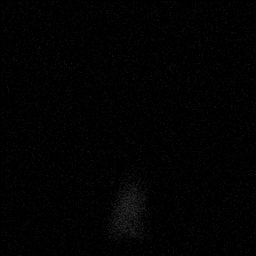

[Series 7: T2 fat-sat · sagittal · 3.0mm · 0.55mm/px · 9 of 33 slices shown (3 of 3)]
[im 1/33]
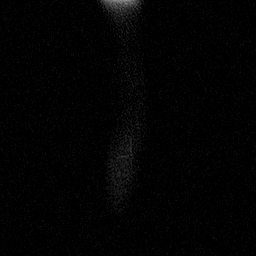
[im 5/33]
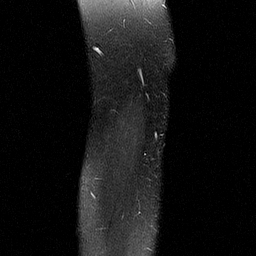
[im 9/33]
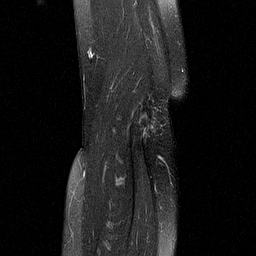
[im 13/33]
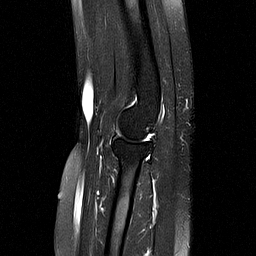
[im 17/33]
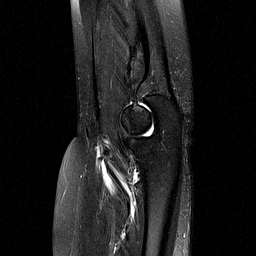
[im 21/33]
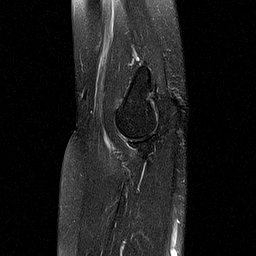
[im 25/33]
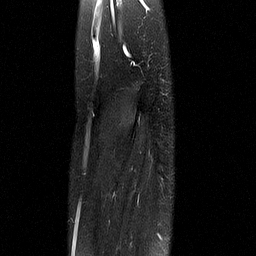
[im 29/33]
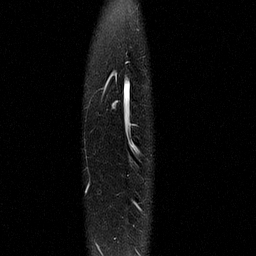
[im 33/33]
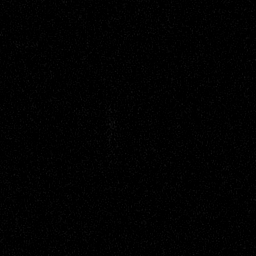

[40 of 40 positions shown; findings below may reference images not displayed]

FINDINGS: TENDONS

Common forearm flexor origin: Intact

Common forearm extensor origin: Mild tendinosis of the common
extensor tendon origin with an interstitial tear.

Biceps: Intact

Triceps: Intact

LIGAMENTS

Medial stabilizers: Intact

Lateral stabilizers:  Intact

Cartilage: No chondral defect.

Joint: No joint effusion. No synovitis.

Cubital tunnel: Normal.

Bones: No fracture or dislocation. No marrow abnormality.

Soft Tissues: Muscles are normal. No fluid collection or hematoma.
Neurovascular bundles are normal.
IMPRESSION: 1. Mild tendinosis of the common extensor tendon origin with an
interstitial tear.

## 2023-02-26 NOTE — Telephone Encounter (Signed)
Authorization Expiration Date: 10/23/2023 New auth date

## 2023-04-24 ENCOUNTER — Other Ambulatory Visit: Payer: Self-pay | Admitting: Physician Assistant

## 2023-04-27 ENCOUNTER — Encounter: Payer: Self-pay | Admitting: Physician Assistant

## 2023-04-27 DIAGNOSIS — Z8639 Personal history of other endocrine, nutritional and metabolic disease: Secondary | ICD-10-CM

## 2023-04-27 DIAGNOSIS — R7303 Prediabetes: Secondary | ICD-10-CM

## 2023-04-27 DIAGNOSIS — E663 Overweight: Secondary | ICD-10-CM

## 2023-05-11 MED ORDER — ZEPBOUND 5 MG/0.5ML ~~LOC~~ SOAJ: 5.0000 mg | SUBCUTANEOUS | 1 refills | Status: DC

## 2023-05-24 ENCOUNTER — Other Ambulatory Visit: Payer: Self-pay | Admitting: Physician Assistant

## 2023-05-24 DIAGNOSIS — N390 Urinary tract infection, site not specified: Secondary | ICD-10-CM

## 2023-05-24 DIAGNOSIS — F331 Major depressive disorder, recurrent, moderate: Secondary | ICD-10-CM

## 2023-05-24 DIAGNOSIS — F332 Major depressive disorder, recurrent severe without psychotic features: Secondary | ICD-10-CM

## 2023-05-24 DIAGNOSIS — F419 Anxiety disorder, unspecified: Secondary | ICD-10-CM

## 2023-09-10 ENCOUNTER — Encounter: Payer: Self-pay | Admitting: Physician Assistant

## 2023-09-10 ENCOUNTER — Ambulatory Visit: Payer: BC Managed Care – PPO | Admitting: Physician Assistant

## 2023-09-10 VITALS — BP 100/72 | HR 112 | Ht 66.0 in | Wt 191.2 lb

## 2023-09-10 DIAGNOSIS — Z4802 Encounter for removal of sutures: Secondary | ICD-10-CM | POA: Insufficient documentation

## 2023-09-10 DIAGNOSIS — S01312D Laceration without foreign body of left ear, subsequent encounter: Secondary | ICD-10-CM | POA: Diagnosis not present

## 2023-09-10 DIAGNOSIS — E6609 Other obesity due to excess calories: Secondary | ICD-10-CM | POA: Diagnosis not present

## 2023-09-10 DIAGNOSIS — E66811 Obesity, class 1: Secondary | ICD-10-CM

## 2023-09-10 DIAGNOSIS — R051 Acute cough: Secondary | ICD-10-CM | POA: Insufficient documentation

## 2023-09-10 DIAGNOSIS — Z6830 Body mass index (BMI) 30.0-30.9, adult: Secondary | ICD-10-CM

## 2023-09-10 MED ORDER — ZEPBOUND 2.5 MG/0.5ML ~~LOC~~ SOAJ: 2.5000 mg | SUBCUTANEOUS | 0 refills | Status: DC

## 2023-09-10 NOTE — Progress Notes (Signed)
 Established Patient Office Visit  Subjective   Patient ID: Cynthia [name], female    DOB: 29-Mar-1971  Age: 53 y.o. MRN: [id number]  CC: suture removal and medication management  HPI Patient is a 53 yo female who presents to clinic for suture removal from left ear. She went to ED on 2/18 after a long sheet of glass was dropped and cut her left earlobe open. She had 3 suture placed. She applied antibiotics and finished all 5 days of her Keflex prescription. She presents to the clinic to have sutures removed.   She would like to start back on Zepbound 2.5mg  weekly injection for weight loss. She had good results in the past but struggled with supply issues. She has gained weight and would like to restart. She tolerated well in the past.   She does have a dry cough. No fever, chills, body aches, SOB. She is taking OTC medications and helping. Just started 2 days ago.   .. Active Ambulatory Problems    Diagnosis Date Noted   HYPERCHOLESTEROLEMIA 04/24/2006   Major depressive disorder, recurrent episode ([facility]) 04/24/2006   Chronic migraine 02/23/2015   Bad dreams 02/23/2015   Overweight (BMI 25.0-29.9) 01/02/2018   Pyelonephritis of right kidney 12/31/2018   Anxiety 05/19/2019   Painless rectal bleeding 11/12/2019   Trouble in sleeping 01/07/2020   Migraine without status migrainosus, not intractable 12/27/2020   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   Chronic right shoulder pain 03/11/2021   Lateral epicondylitis, right elbow 03/22/2021   Hemorrhoids 04/20/2022   Menopausal syndrome 01/31/2022   Prediabetes 07/30/2020   Vitamin D deficiency 07/30/2020   History of obesity 10/11/2022   Visit for suture removal 09/10/2023   Ear lobe laceration, left, subsequent encounter 09/10/2023   Class 1 obesity due to excess calories without serious comorbidity with body mass index (BMI) of 30.0 to 30.9 in adult 09/10/2023   Acute cough 09/10/2023   Resolved Ambulatory Problems    Diagnosis Date  Noted   INFLUENZA, WITH RESPIRATORY SYMPTOMS 04/09/2008   Weight gain 02/15/2017   Appetite increase 02/15/2017   BMI 30.0-30.9,adult 03/14/2017   Breast lump 11/12/2019   Right foot injury, medial talar dome fracture, lateral ligamentous injury 04/10/2022   Past Medical History:  Diagnosis Date   Depression    Hyperlipidemia    No pertinent past medical history     Review of Systems  Respiratory:  Positive for cough.   All other systems reviewed and are negative.    Objective:    BP 100/72 (BP Location: Left Arm, Patient Position: Sitting, Cuff Size: Large)   Pulse (!) 112   Ht 5\' 6"  (1.676 m)   Wt 191 lb 4 oz (86.8 kg)   SpO2 96%   BMI 30.87 kg/m  BP Readings from Last 3 Encounters:  09/10/23 100/72  10/11/22 113/74  07/24/22 116/78   Wt Readings from Last 3 Encounters:  09/10/23 191 lb 4 oz (86.8 kg)  10/11/22 181 lb (82.1 kg)  07/24/22 202 lb (91.6 kg)      Physical Exam Constitutional:      Appearance: Normal appearance.  HENT:     Head: Normocephalic and atraumatic.     Right Ear: Tympanic membrane and ear canal normal.     Left Ear: Tympanic membrane and ear canal normal.     Ears:      Comments: 3 simple interrupted sutures removed from Left ear.  Eyes:     Extraocular Movements: Extraocular movements intact.  Cardiovascular:     Rate and Rhythm: Normal rate and regular rhythm.     Pulses: Normal pulses.     Heart sounds: Normal heart sounds.  Pulmonary:     Effort: Pulmonary effort is normal.     Breath sounds: Normal breath sounds.  Musculoskeletal:        General: Normal range of motion.     Cervical back: Normal range of motion.  Skin:    General: Skin is warm.  Neurological:     Mental Status: She is alert.  Psychiatric:        Mood and Affect: Mood normal.      Assessment & Plan:  Marland KitchenMarland KitchenAzari was seen today for suture / staple removal.  Diagnoses and all orders for this visit:  Ear lobe laceration, left, subsequent  encounter  Visit for suture removal  Class 1 obesity due to excess calories without serious comorbidity with body mass index (BMI) of 30.0 to 30.9 in adult -     tirzepatide (ZEPBOUND) 2.5 MG/0.5ML Pen; Inject 2.5 mg into the skin once a week.  Acute cough  -sutures removed today without complication - patient instructed to follow up if she develops any signs of infection - patient told not to scrub the suture removal site, let warm soapy water fall on it - Restart Zepbound 2.5mg  weekly- discussed side effects and how we will titrate up once approved.  -discussed diet and exercise -symptomatic care for cough with OTC medications and rest  Follow up as needed or in 3 months.   Tandy Gaw, PA-C

## 2023-09-11 ENCOUNTER — Encounter: Payer: Self-pay | Admitting: Physician Assistant

## 2023-11-12 ENCOUNTER — Other Ambulatory Visit: Payer: Self-pay | Admitting: Physician Assistant

## 2023-11-12 DIAGNOSIS — N390 Urinary tract infection, site not specified: Secondary | ICD-10-CM

## 2023-12-24 ENCOUNTER — Encounter: Payer: Self-pay | Admitting: Physician Assistant

## 2023-12-24 DIAGNOSIS — Z8639 Personal history of other endocrine, nutritional and metabolic disease: Secondary | ICD-10-CM

## 2023-12-24 DIAGNOSIS — N390 Urinary tract infection, site not specified: Secondary | ICD-10-CM

## 2023-12-24 DIAGNOSIS — R7303 Prediabetes: Secondary | ICD-10-CM

## 2023-12-24 DIAGNOSIS — E663 Overweight: Secondary | ICD-10-CM

## 2023-12-24 MED ORDER — NITROFURANTOIN MONOHYD MACRO 100 MG PO CAPS: 100.0000 mg | ORAL_CAPSULE | Freq: Every day | ORAL | 3 refills | Status: DC

## 2023-12-24 MED ORDER — ZEPBOUND 5 MG/0.5ML ~~LOC~~ SOAJ: 5.0000 mg | SUBCUTANEOUS | 0 refills | Status: DC

## 2023-12-24 NOTE — Telephone Encounter (Signed)
 Requesting to increase Zepbound  strength  Last written as zepbound  2.5mg  09/10/2023 Zepbound  5mg  showing in chart but written on 05/11/23.  Last OV 09/10/2023 Upcoming appt = none

## 2023-12-26 ENCOUNTER — Telehealth: Payer: Self-pay

## 2023-12-26 ENCOUNTER — Other Ambulatory Visit (HOSPITAL_COMMUNITY): Payer: Self-pay

## 2023-12-26 NOTE — Telephone Encounter (Signed)
 Pharmacy Patient Advocate Encounter   Received notification from CoverMyMeds that prior authorization for Zepbound  5MG /0.5ML pen-injectors is required/requested.   Insurance verification completed.   The patient is insured through [employer] .   Per test claim: PA required and submitted KEY/EOC/Request #: [id number] from 11/26/23 to 08/22/24. Ran test claim, Copay is $24.99. This test claim was processed through [facility]- copay amounts may vary at other pharmacies due to pharmacy/plan contracts, or as the patient moves through the different stages of their insurance plan.

## 2024-01-29 ENCOUNTER — Encounter: Payer: Self-pay | Admitting: Physician Assistant

## 2024-01-29 NOTE — Telephone Encounter (Signed)
 Nurse visit for hx of recurrent UTI's on prophlayxis recent urine odor.

## 2024-01-30 ENCOUNTER — Ambulatory Visit (INDEPENDENT_AMBULATORY_CARE_PROVIDER_SITE_OTHER): Admitting: Physician Assistant

## 2024-01-30 VITALS — BP 104/69 | HR 87 | Ht 66.0 in | Wt 191.2 lb

## 2024-01-30 DIAGNOSIS — N390 Urinary tract infection, site not specified: Secondary | ICD-10-CM | POA: Diagnosis not present

## 2024-01-30 DIAGNOSIS — R8290 Unspecified abnormal findings in urine: Secondary | ICD-10-CM

## 2024-01-30 LAB — POCT URINALYSIS DIP (CLINITEK)
Bilirubin, UA: NEGATIVE
Blood, UA: NEGATIVE
Glucose, UA: NEGATIVE mg/dL
Ketones, POC UA: NEGATIVE mg/dL
Nitrite, UA: POSITIVE — AB
POC PROTEIN,UA: NEGATIVE
Spec Grav, UA: 1.025 (ref 1.010–1.025)
Urobilinogen, UA: 0.2 U/dL
pH, UA: 6 (ref 5.0–8.0)

## 2024-01-30 MED ORDER — SULFAMETHOXAZOLE-TRIMETHOPRIM 800-160 MG PO TABS: 1.0000 | ORAL_TABLET | Freq: Two times a day (BID) | ORAL | 0 refills | Status: DC

## 2024-01-30 NOTE — Progress Notes (Unsigned)
 Pt presents with strong smelling urine. POCT Urinalysis completed. Urine culture ordered.   Per Antoniette, new treatment will be sent to pharmacy. Plan of care will be discussed following culture results. Pt agrees to treatment plan.

## 2024-01-30 NOTE — Telephone Encounter (Signed)
 Attempted call to patient. Left a detailed voice mail message requesting a return call to schedule nurse visit to check urinalysis and urine culture to be processed.

## 2024-01-30 NOTE — Telephone Encounter (Signed)
 Patient scheduled for nurse visit today for urinalysis and urine culture. =kph

## 2024-02-01 ENCOUNTER — Encounter: Payer: Self-pay | Admitting: Physician Assistant

## 2024-02-01 NOTE — Progress Notes (Signed)
 Hx of recurrent UTI and on preventative treatment with macrobid .  Abnormal urine odor noticed.  UA shows leuks and nitrites. Will culture.  Start bactrim  for acute infection. Once we get culture back and discussed preventative changes. No fever, chills, flank pain, nausea, or vomiting.   .. Results for orders placed or performed in visit on 01/30/24  POCT URINALYSIS DIP (CLINITEK)   Collection Time: 01/30/24  3:24 PM  Result Value Ref Range   Color, UA yellow yellow   Clarity, UA clear clear   Glucose, UA negative negative mg/dL   Bilirubin, UA negative negative   Ketones, POC UA negative negative mg/dL   Spec Grav, UA 8.974 8.989 - 1.025   Blood, UA negative negative   pH, UA 6.0 5.0 - 8.0   POC PROTEIN,UA negative negative, trace   Urobilinogen, UA 0.2 0.2 or 1.0 E.U./dL   Nitrite, UA Positive (A) Negative   Leukocytes, UA Trace (A) Negative

## 2024-02-02 LAB — URINE CULTURE

## 2024-02-04 ENCOUNTER — Ambulatory Visit: Payer: Self-pay | Admitting: Physician Assistant

## 2024-02-04 MED ORDER — TRIMETHOPRIM 100 MG PO TABS: 100.0000 mg | ORAL_TABLET | Freq: Every day | ORAL | 1 refills | Status: DC

## 2024-02-04 NOTE — Progress Notes (Signed)
 Klebsiella pneumoniae is resistant to macrobid . Stop macrobid  for prevention. Bactrim  should treat this infection. We will switch to trimethoprim  daily for prevention.

## 2024-02-13 NOTE — Telephone Encounter (Signed)
 Spoke with the pt and advised her the [facility] she said that was fine and that she could f/u with them. Annabella Rigg, CMA

## 2024-03-18 ENCOUNTER — Encounter: Payer: Self-pay | Admitting: Sports Medicine

## 2024-04-11 ENCOUNTER — Encounter: Payer: Self-pay | Admitting: Physician Assistant

## 2024-04-11 NOTE — Telephone Encounter (Signed)
 Ok to send macrobid  100mg  bid for 7 days to pharmacy above.

## 2024-04-14 MED ORDER — NITROFURANTOIN MONOHYD MACRO 100 MG PO CAPS: 100.0000 mg | ORAL_CAPSULE | Freq: Two times a day (BID) | ORAL | 0 refills | Status: DC

## 2024-04-14 NOTE — Telephone Encounter (Signed)
 Attempted call to patient. - left a voicemail message requesting a return call.  [facility] not finding requested  [facility].

## 2024-04-14 NOTE — Telephone Encounter (Signed)
 Spoke with patient. She was traveling back to [place]  Requesting rx be [facility] to  [facility] [place], [place]  She will pick this up and start tomorrow morning

## 2024-05-05 ENCOUNTER — Other Ambulatory Visit: Payer: Self-pay | Admitting: Physician Assistant

## 2024-05-05 DIAGNOSIS — F332 Major depressive disorder, recurrent severe without psychotic features: Secondary | ICD-10-CM

## 2024-05-05 DIAGNOSIS — F419 Anxiety disorder, unspecified: Secondary | ICD-10-CM

## 2024-05-05 DIAGNOSIS — F331 Major depressive disorder, recurrent, moderate: Secondary | ICD-10-CM

## 2024-05-12 ENCOUNTER — Other Ambulatory Visit: Payer: Self-pay | Admitting: Physician Assistant

## 2024-05-12 DIAGNOSIS — E663 Overweight: Secondary | ICD-10-CM

## 2024-05-12 DIAGNOSIS — R7303 Prediabetes: Secondary | ICD-10-CM

## 2024-05-12 DIAGNOSIS — Z8639 Personal history of other endocrine, nutritional and metabolic disease: Secondary | ICD-10-CM

## 2024-05-16 ENCOUNTER — Ambulatory Visit: Admitting: Physician Assistant

## 2024-05-16 ENCOUNTER — Encounter: Payer: Self-pay | Admitting: Physician Assistant

## 2024-05-16 VITALS — BP 119/79 | HR 71 | Ht 66.0 in | Wt 168.0 lb

## 2024-05-16 DIAGNOSIS — N390 Urinary tract infection, site not specified: Secondary | ICD-10-CM | POA: Diagnosis not present

## 2024-05-16 DIAGNOSIS — Z6832 Body mass index (BMI) 32.0-32.9, adult: Secondary | ICD-10-CM

## 2024-05-16 DIAGNOSIS — Z1231 Encounter for screening mammogram for malignant neoplasm of breast: Secondary | ICD-10-CM

## 2024-05-16 DIAGNOSIS — F3342 Major depressive disorder, recurrent, in full remission: Secondary | ICD-10-CM

## 2024-05-16 DIAGNOSIS — R7303 Prediabetes: Secondary | ICD-10-CM | POA: Diagnosis not present

## 2024-05-16 DIAGNOSIS — Z7689 Persons encountering health services in other specified circumstances: Secondary | ICD-10-CM

## 2024-05-16 DIAGNOSIS — E6609 Other obesity due to excess calories: Secondary | ICD-10-CM

## 2024-05-16 DIAGNOSIS — F419 Anxiety disorder, unspecified: Secondary | ICD-10-CM

## 2024-05-16 DIAGNOSIS — E66811 Obesity, class 1: Secondary | ICD-10-CM | POA: Diagnosis not present

## 2024-05-16 LAB — POCT URINALYSIS DIP (CLINITEK)
Bilirubin, UA: NEGATIVE
Blood, UA: NEGATIVE
Glucose, UA: NEGATIVE mg/dL
Ketones, POC UA: NEGATIVE mg/dL
Nitrite, UA: NEGATIVE
POC PROTEIN,UA: NEGATIVE
Spec Grav, UA: 1.02 (ref 1.010–1.025)
Urobilinogen, UA: 0.2 U/dL
pH, UA: 6 (ref 5.0–8.0)

## 2024-05-16 MED ORDER — ZEPBOUND 5 MG/0.5ML ~~LOC~~ SOAJ: 5.0000 mg | SUBCUTANEOUS | 4 refills | Status: AC

## 2024-05-16 MED ORDER — AMOXICILLIN-POT CLAVULANATE 500-125 MG PO TABS: 1.0000 | ORAL_TABLET | Freq: Two times a day (BID) | ORAL | 0 refills | Status: DC

## 2024-05-16 NOTE — Patient Instructions (Addendum)
 Continue on zepbound  5mg  weekly.  Will call with culture results.  Get labs today.

## 2024-05-16 NOTE — Progress Notes (Signed)
 Established Patient Office Visit  Subjective   Patient ID: Cynthia [name], female    DOB: [birth year]  Age: 53 y.o. MRN: [id number]  Chief Complaint  Patient presents with   Medical Management of Chronic Issues    HPI Discussed the use of AI scribe software for clinical note transcription with the patient, who gave verbal consent to proceed.  History of Present Illness Cynthia [name] is a 53 year old female who presents for follow-up on weight loss.  Weight reduction and metabolic health - Tirzepatide  5 mg weekly for the past three months - Weight decreased from 191 pounds to 168 pounds - Significant improvement in overall well-being  Mental health symptoms - Discontinued Prozac  and Wellbutrin  in June - Improvement in anxiety and depression symptoms since discontinuation - Exercise has been beneficial for mental health  Gastrointestinal symptoms - Occasional constipation - No abdominal pain  Urinary tract symptoms - History of recurrent urinary tract infections - Currently taking a daily preventative medication trimethoprim  - Suspects recurrence of urinary tract infection as symptoms have returned after temporary relief     ROS See HPI.    Objective:     BP 119/79   Pulse 71   Ht 5' 6 (1.676 m)   Wt 168 lb (76.2 kg)   SpO2 99%   BMI 27.12 kg/m  BP Readings from Last 3 Encounters:  05/16/24 119/79  01/30/24 104/69  09/10/23 100/72   Wt Readings from Last 3 Encounters:  05/16/24 168 lb (76.2 kg)  01/30/24 191 lb 4 oz (86.8 kg)  09/10/23 191 lb 4 oz (86.8 kg)     .[name]    05/16/2024    2:53 PM 09/10/2023    4:18 PM 07/24/2022    4:20 PM 12/27/2020    9:48 AM 01/07/2020    8:56 AM  Depression screen PHQ 2/9  Decreased Interest 0 3 2 0 2  Down, Depressed, Hopeless 0 1 3 0 0  PHQ - 2 Score 0 4 5 0 2  Altered sleeping 0 3 3 1 3   Tired, decreased energy 1 2 3 2 1   Change in appetite 0 2 3 2 2   Feeling bad or failure about yourself    2 0 0   Trouble concentrating 0  3 2 2   Moving slowly or fidgety/restless 0  0 0 0  Suicidal thoughts 0  0 0 0  PHQ-9 Score 1 11 19 7 10   Difficult doing work/chores Not difficult at all  Very difficult Very difficult Somewhat difficult   ..    05/16/2024    2:54 PM 07/24/2022    4:20 PM 12/27/2020    9:48 AM 01/07/2020    8:57 AM  GAD 7 : Generalized Anxiety Score  Nervous, Anxious, on Edge 1 1 1 1   Control/stop worrying 0 2 1 2   Worry too much - different things 0 1 1 1   Trouble relaxing 0 2 1 1   Restless 0 0 1 0  Easily annoyed or irritable 1 0 1 1  Afraid - awful might happen 0 1 1 2   Total GAD 7 Score 2 7 7 8   Anxiety Difficulty Not difficult at all Very difficult Somewhat difficult Somewhat difficult   .[name] Results for orders placed or performed in visit on 05/16/24  POCT URINALYSIS DIP (CLINITEK)   Collection Time: 05/16/24  3:46 PM  Result Value Ref Range   Color, UA yellow yellow   Clarity, UA clear clear   Glucose,  UA negative negative mg/dL   Bilirubin, UA negative negative   Ketones, POC UA negative negative mg/dL   Spec Grav, UA 8.979 8.989 - 1.025   Blood, UA negative negative   pH, UA 6.0 5.0 - 8.0   POC PROTEIN,UA negative negative, trace   Urobilinogen, UA 0.2 0.2 or 1.0 E.U./dL   Nitrite, UA Negative Negative   Leukocytes, UA Trace (A) Negative     Physical Exam Constitutional:      Appearance: Normal appearance. She is obese.  HENT:     Head: Normocephalic.  Cardiovascular:     Rate and Rhythm: Normal rate and regular rhythm.  Pulmonary:     Effort: Pulmonary effort is normal.     Breath sounds: Normal breath sounds.  Abdominal:     General: There is no distension.     Palpations: Abdomen is soft. There is no mass.     Tenderness: There is no abdominal tenderness. There is no right CVA tenderness, left CVA tenderness, guarding or rebound.  Neurological:     General: No focal deficit present.     Mental Status: She is alert and oriented to person,  place, and time.  Psychiatric:        Mood and Affect: Mood normal.       Assessment & Plan:  SABRASABRANadya was seen today for medical management of chronic issues.  Diagnoses and all orders for this visit:  Recurrent UTI -     CMP14+EGFR -     CBC w/Diff/Platelet -     POCT URINALYSIS DIP (CLINITEK) -     Urine Culture -     amoxicillin -clavulanate (AUGMENTIN ) 500-125 MG tablet; Take 1 tablet by mouth 2 (two) times daily.  Class 1 obesity due to excess calories without serious comorbidity with body mass index (BMI) of 32.0 to 32.9 in adult -     tirzepatide  (ZEPBOUND ) 5 MG/0.5ML Pen; Inject 5 mg into the skin once a week. INJECT 5 MG SUBCUTANEOUSLY WEEKLY  Prediabetes -     tirzepatide  (ZEPBOUND ) 5 MG/0.5ML Pen; Inject 5 mg into the skin once a week. INJECT 5 MG SUBCUTANEOUSLY WEEKLY -     CMP14+EGFR -     Hemoglobin A1c  Encounter for weight management -     tirzepatide  (ZEPBOUND ) 5 MG/0.5ML Pen; Inject 5 mg into the skin once a week. INJECT 5 MG SUBCUTANEOUSLY WEEKLY  Anxiety  Recurrent major depressive disorder, in full remission  Visit for screening mammogram -     MM 3D SCREENING MAMMOGRAM BILATERAL BREAST    Assessment & Plan Obese, status post significant weight loss on tirzepatide  Significant weight loss of 23 pounds over three months with tirzepatide  5 mg weekly. No side effects reported. Decision to maintain current dose to avoid potential side effects of higher doses. - Continue tirzepatide  5 mg weekly for six months. - Encouraged healthy diet and increased physical activity. - Emphasized adequate protein intake to prevent malnutrition.  Pre-diabetes - A1C ordered today  Possible recurrent urinary tract infection Symptoms suggestive of a urinary tract infection with previous episodes resolving temporarily with prophylactic treatment. Currently on daily prophylactic medication. UA positive for trace leukocytes.  - Ordered urinalysis and urine culture. Will  treat based on last culture with amoxil  if symptoms persist and culture not back yet.  - CMP to check kidney function, CBC to look for any signs of infection - Continue daily prophylactic medication.  Depression, improved and off medication Anxiety, improved and off medication PHQ/GAD to  goal No current need for medication. Exercise recommended as a beneficial non-pharmacological intervention. - Encouraged regular exercise for mood management. - follow up as needed  Medication management - CMP ordered  Declined flu/covid/pneumonia vaccine  Mammogram ordered placed today.      Return in about 6 months (around 11/13/2024).    Luv Mish, PA-C

## 2024-05-17 LAB — CMP14+EGFR
ALT: 13 IU/L (ref 0–32)
AST: 15 IU/L (ref 0–40)
Albumin: 4.6 g/dL (ref 3.8–4.9)
Alkaline Phosphatase: 111 IU/L (ref 49–135)
BUN/Creatinine Ratio: 11 (ref 9–23)
BUN: 10 mg/dL (ref 6–24)
Bilirubin Total: 0.6 mg/dL (ref 0.0–1.2)
CO2: 19 mmol/L — ABNORMAL LOW (ref 20–29)
Calcium: 9.8 mg/dL (ref 8.7–10.2)
Chloride: 105 mmol/L (ref 96–106)
Creatinine, Ser: 0.87 mg/dL (ref 0.57–1.00)
Globulin, Total: 1.8 g/dL (ref 1.5–4.5)
Glucose: 85 mg/dL (ref 70–99)
Potassium: 4.2 mmol/L (ref 3.5–5.2)
Sodium: 140 mmol/L (ref 134–144)
Total Protein: 6.4 g/dL (ref 6.0–8.5)
eGFR: 80 mL/min/1.73 (ref >59)

## 2024-05-17 LAB — CBC WITH DIFFERENTIAL/PLATELET
Basophils Absolute: 0.1 x10E3/uL (ref 0.0–0.2)
Basos: 1 %
EOS (ABSOLUTE): 0.1 x10E3/uL (ref 0.0–0.4)
Eos: 1 %
Hematocrit: 44.9 % (ref 34.0–46.6)
Hemoglobin: 14.7 g/dL (ref 11.1–15.9)
Immature Grans (Abs): 0 x10E3/uL (ref 0.0–0.1)
Immature Granulocytes: 0 %
Lymphocytes Absolute: 3 x10E3/uL (ref 0.7–3.1)
Lymphs: 45 %
MCH: 28.8 pg (ref 26.6–33.0)
MCHC: 32.7 g/dL (ref 31.5–35.7)
MCV: 88 fL (ref 79–97)
Monocytes Absolute: 0.4 x10E3/uL (ref 0.1–0.9)
Monocytes: 6 %
Neutrophils Absolute: 3.1 x10E3/uL (ref 1.4–7.0)
Neutrophils: 47 %
Platelets: 337 x10E3/uL (ref 150–450)
RBC: 5.1 x10E6/uL (ref 3.77–5.28)
RDW: 12.8 % (ref 11.7–15.4)
WBC: 6.7 x10E3/uL (ref 3.4–10.8)

## 2024-05-17 LAB — HEMOGLOBIN A1C
Est. average glucose Bld gHb Est-mCnc: 108 mg/dL
Hgb A1c MFr Bld: 5.4 % (ref 4.8–5.6)

## 2024-05-19 ENCOUNTER — Ambulatory Visit: Payer: Self-pay | Admitting: Physician Assistant

## 2024-05-19 DIAGNOSIS — N390 Urinary tract infection, site not specified: Secondary | ICD-10-CM

## 2024-05-19 LAB — URINE CULTURE

## 2024-05-19 NOTE — Progress Notes (Signed)
 Cynthia [name],   A1c improved and in normal range.  Gram Negative Rods detected so if you are having symptoms start the augmentin . I think seeing urology is a good next step. Are you ok with this?

## 2024-05-19 NOTE — Progress Notes (Signed)
 Culture susceptibility sensitive to augmentin !

## 2024-06-23 ENCOUNTER — Encounter: Payer: Self-pay | Admitting: Urology

## 2024-06-23 ENCOUNTER — Ambulatory Visit: Admitting: Urology

## 2024-06-23 VITALS — BP 128/87 | HR 98 | Ht 66.0 in | Wt 166.0 lb

## 2024-06-23 DIAGNOSIS — R8290 Unspecified abnormal findings in urine: Secondary | ICD-10-CM

## 2024-06-23 DIAGNOSIS — N390 Urinary tract infection, site not specified: Secondary | ICD-10-CM | POA: Insufficient documentation

## 2024-06-23 DIAGNOSIS — N200 Calculus of kidney: Secondary | ICD-10-CM | POA: Insufficient documentation

## 2024-06-23 DIAGNOSIS — Z87440 Personal history of urinary (tract) infections: Secondary | ICD-10-CM

## 2024-06-23 LAB — URINALYSIS, ROUTINE W REFLEX MICROSCOPIC
Glucose, UA: NEGATIVE
Nitrite, UA: POSITIVE — AB
Specific Gravity, UA: 1.025 (ref 1.005–1.030)
Urobilinogen, Ur: 1 mg/dL (ref 0.2–1.0)
pH, UA: 5.5 (ref 5.0–7.5)

## 2024-06-23 LAB — MICROSCOPIC EXAMINATION: WBC, UA: >30 /HPF — AB (ref 0–5)

## 2024-06-23 LAB — BLADDER SCAN AMB NON-IMAGING

## 2024-06-23 MED ORDER — SULFAMETHOXAZOLE-TRIMETHOPRIM 800-160 MG PO TABS: 1.0000 | ORAL_TABLET | Freq: Two times a day (BID) | ORAL | 0 refills | Status: AC

## 2024-06-23 MED ORDER — ESTRADIOL 0.01 % VA CREA: TOPICAL_CREAM | VAGINAL | 12 refills | Status: AC

## 2024-06-23 NOTE — Progress Notes (Signed)
 Assessment: 1. Frequent UTI   2. Nephrolithiasis   3. Abnormal urine findings     Plan: I personally reviewed the patient's chart including provider notes, lab results. Methods to reduce the risk of UTIs discussed with the patient including increase fluid intake, timed voiding, daily cranberry supplement, daily probiotic, vaginal hormone cream, and daily antibiotic prophylaxis. Urine culture sent today. Begin Bactrim  DS BID x 5 days.  Rx sent. Will contact her with results. Discussed potential benefit of daily antibiotic prophylaxis. Begin vaginal hormone cream 2-3 times per week.  Prescription sent. CT renal stone study ordered. Return to office in 6 weeks.   Chief Complaint:  Chief Complaint  Patient presents with   Frequent UTI    History of Present Illness:  Cynthia [name] is a 53 y.o. female who is seen in consultation from [place], Vermell CROME, PA-C for evaluation of recurrent UTI. 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  She has previously undergone evaluation by urology 20 years ago.  She apparently had cystoscopy with showed evidence of chronic cystitis.  She has previously been managed with daily antibiotic suppression. Typical UTI symptoms include cloudy urine with odor, dysuria, and increased frequency and urgency.  She typically does not have flank pain, fever, chills, or gross hematuria. She was on daily trimethoprim  until approximately 4-5 months ago. She currently reports symptoms of dysuria and urine odor. She has noted some increased frequency and urgency. No history of kidney stones. Her UTIs are not associated with intercourse. She began menopause approximately 3 years ago.  Urine culture results: 1/23 >100K Proteus 7/25 >100 K Klebsiella 11/25 50-100K Klebsiella  Renal ultrasound from 6/20 showed renal cortical thinning, left lower pole renal calculus.  Past Medical History:  Past Medical History:  Diagnosis Date    Anxiety    Depression    Hyperlipidemia    No pertinent past medical history     Past Surgical History:  Past Surgical History:  Procedure Laterality Date   BREAST BIOPSY Right 07/16/2013   acute and chronic inflammatory changes and ectatic ducts   TONSILLECTOMY  1996   WISDOM TOOTH EXTRACTION  1990    Allergies:  Allergies  Allergen Reactions   Promethazine Hcl     REACTION: convulsion   Trazodone  And Nefazodone     Crazy feelings.     Family History:  Family History  Problem Relation Age of Onset   Parkinson's disease Mother    Colon polyps Father    Diabetes Father    Skin cancer Father    Lupus Sister    Breast cancer Sister    Cancer Maternal Grandmother    Breast cancer Maternal Grandmother    Alcohol abuse Paternal Grandfather    Stomach cancer Neg Hx    Rectal cancer Neg Hx    Esophageal cancer Neg Hx    Colon cancer Neg Hx     Social History:  Social History   Tobacco Use   Smoking status: Never   Smokeless tobacco: Never  Vaping Use   Vaping status: Never Used  Substance Use Topics   Alcohol use: Yes    Comment: occasional beer   Drug use: No    Review of symptoms:  Constitutional:  Negative for unexplained weight loss, night sweats, fever, chills ENT:  Negative for nose bleeds, sinus pain, painful swallowing CV:  Negative for chest pain, shortness of breath, exercise intolerance, palpitations, loss of consciousness Resp:  Negative for cough,  wheezing, shortness of breath GI:  Negative for nausea, vomiting, diarrhea, bloody stools GU:  Positives noted in HPI; otherwise negative for gross hematuria Neuro:  Negative for seizures, poor balance, limb weakness, slurred speech Psych:  Negative for lack of energy, depression, anxiety Endocrine:  Negative for polydipsia, polyuria, symptoms of hypoglycemia (dizziness, hunger, sweating) Hematologic:  Negative for anemia, purpura, petechia, prolonged or excessive bleeding, use of anticoagulants   Allergic:  Negative for difficulty breathing or choking as a result of exposure to anything; no shellfish allergy; no allergic response (rash/itch) to materials, foods  Physical exam: BP 128/87   Pulse 98   Ht 5' 6 (1.676 m)   Wt 166 lb (75.3 kg)   BMI 26.79 kg/m  GENERAL APPEARANCE:  Well appearing, well developed, well nourished, NAD HEENT: Atraumatic, Normocephalic, oropharynx clear. NECK: Supple without lymphadenopathy or thyromegaly. LUNGS: Clear to auscultation bilaterally. HEART: Regular Rate and Rhythm without murmurs, gallops, or rubs. ABDOMEN: Soft, non-tender, No Masses. EXTREMITIES: Moves all extremities well.  Without clubbing, cyanosis, or edema. NEUROLOGIC:  Alert and oriented x 3, normal gait, CN II-XII grossly intact.  MENTAL STATUS:  Appropriate. BACK:  Non-tender to palpation.  No CVAT SKIN:  Warm, dry and intact.    Results: U/A: >30 WBCs, 0-2 RBCs, many packed.,  Nitrite positive  PVR = 0 mL

## 2024-06-24 ENCOUNTER — Inpatient Hospital Stay
Admission: RE | Admit: 2024-06-24 | Discharge: 2024-06-24 | Attending: Physician Assistant | Admitting: Physician Assistant

## 2024-06-25 LAB — URINE CULTURE

## 2024-06-26 ENCOUNTER — Ambulatory Visit: Payer: Self-pay | Admitting: Urology

## 2024-06-26 DIAGNOSIS — N390 Urinary tract infection, site not specified: Secondary | ICD-10-CM

## 2024-06-26 MED ORDER — TRIMETHOPRIM 100 MG PO TABS: 100.0000 mg | ORAL_TABLET | Freq: Every day | ORAL | 2 refills | Status: AC

## 2024-06-30 NOTE — Progress Notes (Signed)
 Normal mammogram. Follow up in 1 year.

## 2024-07-02 NOTE — Addendum Note (Signed)
 Addended by: OBADIAH ROSELEE RAMAN on: 07/02/2024 03:13 PM   Modules accepted: Orders

## 2024-08-13 ENCOUNTER — Ambulatory Visit: Admitting: Urology

## 2024-08-15 ENCOUNTER — Encounter: Payer: Self-pay | Admitting: Physician Assistant

## 2024-08-22 NOTE — Telephone Encounter (Signed)
 Per the patient - a prior authorization is required for Zepbound . Dx: obesity. Thanks in advance.

## 2024-11-14 ENCOUNTER — Ambulatory Visit: Admitting: Physician Assistant
